# Patient Record
Sex: Female | Born: 1935 | Race: White | Hispanic: No | Smoking: Former smoker
Health system: Southern US, Community
[De-identification: ages and names within clinical notes are randomized; demographics above are authoritative.]

## PROBLEM LIST (undated history)

## (undated) DIAGNOSIS — J449 Chronic obstructive pulmonary disease, unspecified: Secondary | ICD-10-CM

## (undated) DIAGNOSIS — D649 Anemia, unspecified: Secondary | ICD-10-CM

## (undated) DIAGNOSIS — I719 Aortic aneurysm of unspecified site, without rupture: Secondary | ICD-10-CM

## (undated) DIAGNOSIS — N189 Chronic kidney disease, unspecified: Secondary | ICD-10-CM

## (undated) DIAGNOSIS — I10 Essential (primary) hypertension: Secondary | ICD-10-CM

## (undated) DIAGNOSIS — K5790 Diverticulosis of intestine, part unspecified, without perforation or abscess without bleeding: Secondary | ICD-10-CM

## (undated) DIAGNOSIS — H9192 Unspecified hearing loss, left ear: Secondary | ICD-10-CM

## (undated) DIAGNOSIS — K551 Chronic vascular disorders of intestine: Secondary | ICD-10-CM

## (undated) DIAGNOSIS — I7092 Chronic total occlusion of artery of the extremities: Secondary | ICD-10-CM

## (undated) DIAGNOSIS — K56609 Unspecified intestinal obstruction, unspecified as to partial versus complete obstruction: Secondary | ICD-10-CM

## (undated) DIAGNOSIS — I495 Sick sinus syndrome: Secondary | ICD-10-CM

## (undated) DIAGNOSIS — Z45010 Encounter for checking and testing of cardiac pacemaker pulse generator [battery]: Secondary | ICD-10-CM

## (undated) DIAGNOSIS — I471 Supraventricular tachycardia: Secondary | ICD-10-CM

## (undated) HISTORY — DX: Chronic total occlusion of artery of the extremities: I70.92

## (undated) HISTORY — DX: Supraventricular tachycardia: I47.1

## (undated) HISTORY — DX: Diverticulosis of intestine, part unspecified, without perforation or abscess without bleeding: K57.90

## (undated) HISTORY — DX: Sick sinus syndrome: I49.5

## (undated) HISTORY — DX: Anemia, unspecified: D64.9

## (undated) HISTORY — DX: Essential (primary) hypertension: I10

## (undated) HISTORY — DX: Unspecified hearing loss, left ear: H91.92

## (undated) HISTORY — PX: APPENDECTOMY: SHX54

## (undated) HISTORY — DX: Chronic vascular disorders of intestine: K55.1

## (undated) HISTORY — DX: Chronic kidney disease, unspecified: N18.9

## (undated) HISTORY — DX: Unspecified intestinal obstruction, unspecified as to partial versus complete obstruction: K56.609

## (undated) HISTORY — PX: ABDOMINAL HYSTERECTOMY: SHX81

## (undated) HISTORY — DX: Encounter for checking and testing of cardiac pacemaker pulse generator (battery): Z45.010

## (undated) HISTORY — PX: PACEMAKER INSERTION: SHX728

---

## 2004-11-08 ENCOUNTER — Inpatient Hospital Stay (HOSPITAL_COMMUNITY): Admission: EM | Admit: 2004-11-08 | Discharge: 2004-11-12 | Payer: Self-pay | Admitting: *Deleted

## 2004-12-10 ENCOUNTER — Emergency Department (HOSPITAL_COMMUNITY): Admission: EM | Admit: 2004-12-10 | Discharge: 2004-12-11 | Payer: Self-pay | Admitting: Emergency Medicine

## 2004-12-12 ENCOUNTER — Inpatient Hospital Stay (HOSPITAL_COMMUNITY): Admission: EM | Admit: 2004-12-12 | Discharge: 2004-12-21 | Payer: Self-pay | Admitting: Emergency Medicine

## 2004-12-15 ENCOUNTER — Ambulatory Visit: Payer: Self-pay | Admitting: Internal Medicine

## 2005-04-06 ENCOUNTER — Inpatient Hospital Stay (HOSPITAL_COMMUNITY): Admission: EM | Admit: 2005-04-06 | Discharge: 2005-04-10 | Payer: Self-pay | Admitting: Family Medicine

## 2010-04-13 ENCOUNTER — Encounter: Payer: Self-pay | Admitting: Internal Medicine

## 2010-07-20 ENCOUNTER — Emergency Department (HOSPITAL_COMMUNITY): Payer: Medicare Other

## 2010-07-20 ENCOUNTER — Inpatient Hospital Stay (HOSPITAL_COMMUNITY)
Admission: EM | Admit: 2010-07-20 | Discharge: 2010-08-02 | DRG: 356 | Disposition: A | Discharge status: Home Health | Payer: Medicare Other | Attending: Internal Medicine | Admitting: Internal Medicine

## 2010-07-20 DIAGNOSIS — N289 Disorder of kidney and ureter, unspecified: Secondary | ICD-10-CM | POA: Diagnosis present

## 2010-07-20 DIAGNOSIS — R627 Adult failure to thrive: Secondary | ICD-10-CM | POA: Diagnosis present

## 2010-07-20 DIAGNOSIS — I774 Celiac artery compression syndrome: Secondary | ICD-10-CM | POA: Diagnosis present

## 2010-07-20 DIAGNOSIS — IMO0002 Reserved for concepts with insufficient information to code with codable children: Secondary | ICD-10-CM

## 2010-07-20 DIAGNOSIS — I10 Essential (primary) hypertension: Secondary | ICD-10-CM | POA: Diagnosis present

## 2010-07-20 DIAGNOSIS — I701 Atherosclerosis of renal artery: Secondary | ICD-10-CM | POA: Diagnosis present

## 2010-07-20 DIAGNOSIS — I6529 Occlusion and stenosis of unspecified carotid artery: Secondary | ICD-10-CM | POA: Diagnosis present

## 2010-07-20 DIAGNOSIS — Z9079 Acquired absence of other genital organ(s): Secondary | ICD-10-CM

## 2010-07-20 DIAGNOSIS — J189 Pneumonia, unspecified organism: Secondary | ICD-10-CM | POA: Diagnosis not present

## 2010-07-20 DIAGNOSIS — Z950 Presence of cardiac pacemaker: Secondary | ICD-10-CM

## 2010-07-20 DIAGNOSIS — Z79899 Other long term (current) drug therapy: Secondary | ICD-10-CM

## 2010-07-20 DIAGNOSIS — K551 Chronic vascular disorders of intestine: Principal | ICD-10-CM | POA: Diagnosis present

## 2010-07-20 DIAGNOSIS — I739 Peripheral vascular disease, unspecified: Secondary | ICD-10-CM | POA: Diagnosis present

## 2010-07-20 DIAGNOSIS — I708 Atherosclerosis of other arteries: Secondary | ICD-10-CM | POA: Diagnosis present

## 2010-07-20 DIAGNOSIS — N269 Renal sclerosis, unspecified: Secondary | ICD-10-CM | POA: Diagnosis present

## 2010-07-20 DIAGNOSIS — I700 Atherosclerosis of aorta: Secondary | ICD-10-CM | POA: Diagnosis present

## 2010-07-20 DIAGNOSIS — I70209 Unspecified atherosclerosis of native arteries of extremities, unspecified extremity: Secondary | ICD-10-CM | POA: Diagnosis present

## 2010-07-20 DIAGNOSIS — Z01812 Encounter for preprocedural laboratory examination: Secondary | ICD-10-CM

## 2010-07-20 DIAGNOSIS — E440 Moderate protein-calorie malnutrition: Secondary | ICD-10-CM | POA: Diagnosis present

## 2010-07-20 DIAGNOSIS — E162 Hypoglycemia, unspecified: Secondary | ICD-10-CM | POA: Diagnosis not present

## 2010-07-20 DIAGNOSIS — N179 Acute kidney failure, unspecified: Secondary | ICD-10-CM | POA: Diagnosis present

## 2010-07-20 DIAGNOSIS — J9600 Acute respiratory failure, unspecified whether with hypoxia or hypercapnia: Secondary | ICD-10-CM | POA: Diagnosis not present

## 2010-07-20 DIAGNOSIS — E860 Dehydration: Secondary | ICD-10-CM | POA: Diagnosis present

## 2010-07-20 DIAGNOSIS — D62 Acute posthemorrhagic anemia: Secondary | ICD-10-CM | POA: Diagnosis not present

## 2010-07-20 DIAGNOSIS — F17200 Nicotine dependence, unspecified, uncomplicated: Secondary | ICD-10-CM | POA: Diagnosis present

## 2010-07-20 LAB — COMPREHENSIVE METABOLIC PANEL
ALT: 11 U/L (ref 0–35)
Alkaline Phosphatase: 83 U/L (ref 39–117)
CO2: 27 mEq/L (ref 19–32)
Calcium: 10 mg/dL (ref 8.4–10.5)
Chloride: 97 mEq/L (ref 96–112)
Creatinine, Ser: 1.27 mg/dL — ABNORMAL HIGH (ref 0.4–1.2)
GFR calc non Af Amer: 40 mL/min — ABNORMAL LOW (ref >60)
Potassium: 4 mEq/L (ref 3.5–5.1)
Total Bilirubin: 0.6 mg/dL (ref 0.3–1.2)
Total Protein: 6.7 g/dL (ref 6.0–8.3)

## 2010-07-20 LAB — CBC
HCT: 41.8 % (ref 36.0–46.0)
Hemoglobin: 14 g/dL (ref 12.0–15.0)
MCH: 30.8 pg (ref 26.0–34.0)
MCHC: 33.5 g/dL (ref 30.0–36.0)
RDW: 13.7 % (ref 11.5–15.5)

## 2010-07-20 LAB — POCT CARDIAC MARKERS
CKMB, poc: 1.3 ng/mL (ref 1.0–8.0)
Troponin i, poc: <0.05 ng/mL (ref 0.00–0.09)

## 2010-07-20 MED ORDER — IOHEXOL 300 MG/ML  SOLN
80.0000 mL | Freq: Once | INTRAMUSCULAR | Status: AC | PRN
  Administered 2010-07-20: 80 mL via INTRAVENOUS

## 2010-07-21 LAB — COMPREHENSIVE METABOLIC PANEL
AST: 13 U/L (ref 0–37)
Albumin: 2.5 g/dL — ABNORMAL LOW (ref 3.5–5.2)
BUN: 32 mg/dL — ABNORMAL HIGH (ref 6–23)
Calcium: 9 mg/dL (ref 8.4–10.5)
Creatinine, Ser: 1.25 mg/dL — ABNORMAL HIGH (ref 0.4–1.2)
GFR calc non Af Amer: 41 mL/min — ABNORMAL LOW (ref >60)
Potassium: 4.9 mEq/L (ref 3.5–5.1)
Total Protein: 5.1 g/dL — ABNORMAL LOW (ref 6.0–8.3)

## 2010-07-21 LAB — DIFFERENTIAL
Basophils Absolute: 0 K/uL (ref 0.0–0.1)
Basophils Relative: 0 % (ref 0–1)
Eosinophils Absolute: 0.1 K/uL (ref 0.0–0.7)
Eosinophils Relative: 1 % (ref 0–5)
Lymphocytes Relative: 15 % (ref 12–46)
Lymphs Abs: 1.3 K/uL (ref 0.7–4.0)
Monocytes Absolute: 0.9 K/uL (ref 0.1–1.0)
Monocytes Relative: 10 % (ref 3–12)
Neutro Abs: 6.6 K/uL (ref 1.7–7.7)
Neutrophils Relative %: 74 % (ref 43–77)

## 2010-07-21 LAB — CBC
HCT: 35 % — ABNORMAL LOW (ref 36.0–46.0)
Hemoglobin: 11.2 g/dL — ABNORMAL LOW (ref 12.0–15.0)
MCH: 30.1 pg (ref 26.0–34.0)
MCHC: 32 g/dL (ref 30.0–36.0)
MCV: 94.1 fL (ref 78.0–100.0)
Platelets: 177 K/uL (ref 150–400)
RBC: 3.72 MIL/uL — ABNORMAL LOW (ref 3.87–5.11)
RDW: 13.7 % (ref 11.5–15.5)
WBC: 9 K/uL (ref 4.0–10.5)

## 2010-07-21 LAB — LIPID PANEL
Cholesterol: 132 mg/dL (ref 0–200)
HDL: 36 mg/dL — ABNORMAL LOW (ref >39)
Total CHOL/HDL Ratio: 3.7 RATIO
VLDL: 21 mg/dL (ref 0–40)

## 2010-07-21 NOTE — Consult Note (Signed)
[name] [name], [name] [name]            ACCOUNT NO.:  [id number]  MEDICAL RECORD NO.:  [id number]           PATIENT TYPE:  E  LOCATION:  WLED                         FACILITY:  [facility]  PHYSICIAN:  Fransisco Hertz, MD       DATE OF BIRTH:  12/27/1923  DATE OF CONSULTATION: DATE OF DISCHARGE:                                CONSULTATION   REQUESTING PHYSICIAN:  This consultation was requested by Dr. Patria Mane with [facility].  REASON FOR CONSULTATION:  Rule out mesenteric ischemia.  HISTORY OF PRESENT ILLNESS:  This is an 77 year old female with multiple atherosclerotic risk factors who presents with a chief complaint of abdominal pain.  This patient has had a previous history of partial small-bowel obstruction and recently presented with symptomatology, that she thinks it is different from that.  She describes this as pain that is intermittent and vague, present throughout her abdomen that intensifies with food intake, even smell of food induces nausea and pain in her abdomen.  She has consistently had food fear for now about 2 months  and over the last 2 weeks, the daughter notes over 12 pounds weight loss. Some of these symptoms apparently began to occur in last October and in total she has lost around 70 pounds since then.  Most recently over the last few days she has also had some nausea, vomiting and also diarrhea.  While she was at the ER, she vomited up for oral contrast dye.  She notes from the diarrhea she has no frank hematochezia.  She denies any type of melena.  PAST MEDICAL HISTORY: 1. Hypertension. 2. Tachy-brady syndrome causing syncope. 3. History of paroxysmal supraventricular tachycardia. 4. History of partial small-bowel obstruction. 5. History of diverticulosis.  PAST SURGICAL HISTORY:  Included open appendectomy, a total abdominal hysterectomy, and placement of permanent pacemaker.  SOCIAL HISTORY:  She had a 70-pack year history of smoking cigarettes. Denies  any alcohol or illicit drug use.  FAMILY HISTORY:  Her father died at 71 of some type of fungal infection. Mother died at 64 of end-stage renal disease.  REVIEW OF SYSTEMS:  As noted above, otherwise noted to be negative.  PHYSICAL EXAMINATION:  VITAL SIGNS:  She had a temperature of 97.8, blood pressure 171/42, heart rate of 77, respirations were 18. GENERAL:  In general examination, she appears elderly, no apparent distress, alert and oriented x3. HEENT:  On head exam, normocephalic, atraumatic.  She had obvious temporalis wasting.  ENT Exam:  Hearing is grossly intact.  Oropharynx without any erythema or exudate both of her upper and lower teeth or dentures.  Nares without any drainage or any type of erythema.  On eye exam, pupils were equal, round and reactive to light.  Extraocular movements were intact. NECK:  Supple neck with no nuchal rigidity.  No obvious lymphadenopathy. PULMONARY EXAM:  She has symmetric expansion.  Good air movement.  No rales, rhonchi or wheezing. CARDIAC EXAM:  Regular rate and rhythm.  Normal S1, S2.  I was looking at her tracing on the monitor and there is clear regular firing of the pacemaker which is driving this regular rhythm.  VASCULAR EXAMINATION:  She had easily palpable radial pulses but I had some difficulty finding brachials on both sides.  Carotids were palpable bilaterally with no obvious bruit on the right.  There was a faint bruit on the left.  Aorta was not easily palpated.  I did not feel a big pulsatile mass in the midline.  Bilateral femorals were easily palpable. Bilateral popliteals were easily palpable.  She had easily palpable dorsalis pedis.  I did not feel posterior tibial on either side. ABDOMEN:  On abdominal exam, she is thin, soft abdomen, mild tenderness to palpation throughout without any frank guarding.  There are no obvious masses.  No hepatosplenomegaly.  No costovertebral angle tenderness.  She has a well-healed right  lower quadrant transverse incision and a lower midline incision consistent with the previous operation she has had. MUSCULOSKELETAL:  On musculoskeletal exam, a 5/5 strength in all extremities with obvious muscular atrophy.  There is no ischemic changes in any extremity. NEUROLOGIC:  On neurologic examination, cranial nerves II through XII are intact.  Her motor strength was above.  Sensation is grossly intact in all extremities. PSYCHIATRIC:  On psych exam, judgment appeared to be intact.  Her judgment and mood were appropriate for her clinical situation. SKIN:  On skin exam, there was no obvious rashes anywhere. LYMPHATICS:  On lymphatics exam, there were no cervical, axillary or inguinal lymphadenopathy.  RADIOLOGIC STUDIES:  I reviewed the CT of abdomen and pelvis, this was done with 5 mm cuts which fundamentally limits the quality of the interpretation.  It was read as possible inflammation at the proximal jejunum and duodenum, however, I do not really appreciated this on my evaluation of it.  Additionally, there was a comment made on possible segmental occlusion at SMA, however, I think the cuts on this film are too coarse to make that type of diagnosis.  There is frank contrast present in the celiac, SMA and IMA, so this demonstrates basically that this patient's abdominal pain is not secondary to acute mesenteric ischemia as there is frank perfusion in all 3 vessels.  Also the timing of fill in the SMA is at the exact timing as the celiac which would not be expected if in fact there was occlusion at the SMA and subsequently  collateral filling at the SMA.  I do not note any frank masses in the abdomen to account for this patient's symptomatology.  She does have an extensive amount of stool in her colon, however.  LABORATORY DATA:  Also reviewed laboratory studies.  Lipase was 22. Chemistries; sodium 133, potassium 4.0, chloride of 97, bicarb 27, glucose 104, BUN 40,  creatinine was up to 1.27.  Also LFTs were done. Total bilirubin 0.6, alk phos 83, AST was 11, ALT is 11, total protein 6.7, albumin was 3.2, calcium 1.0.  Cardiac markers were completely negative on this patient.  There was a CBC, complete white count of 15.0, H and H of 14.0 and 41.8, platelet count 132.  DIAGNOSES: 1. Failure to thrive in this patient.  She had dropped over 70 pounds     over 6 months that was not intentional, poor p.o. tolerance to     intake. 2. She has some abdominal pain along with her additional     symptomatology associated with this pain.  It is not absolutely     clear to me that this pain is secondary to mesenteric ischemia. 3. Possible chronic mesenteric ischemia, per history is somewhat  consistent with chronic mesenteric ischemia.  The weight loss is     consistent, the food fear; however, for 6 months of 70 pounds     weight loss, I would have expected more protein wasting than I am     seeing on her LFTs.  Also I am kind of surprised with the findings     on the CTA with such good perfusion noted in all 3 mesenteric     vessels and based on the findings on the CT, I think it is highly     unlikely there is acute mesenteric ischemia involved.  The 5-mm cut non-     CTA images make it difficult to fully evaluate the vasculature.  My     gold standard in this situation would be mesenteric angiogram but     she currently has creatinine of 1.27, this is likely related to     dehydration placed on her history of diarrhea and chronic nausea     and vomiting.  So, now after receiving 80 cc of contrast, I expect     her to possibly develop some contrast nephropathy, so I would     expect creatinine actually to increase even further.  So, she needs     to be resuscitated, admitted to medical service, resuscitated, and     we will see how her kidney function recovers.  Other things, she     needs to have full set of nutrition labs drawn including prealbumin      and CRP to get idea of how much protein wasting her body has truly     sustained.  In general, she also needs, based on the extensive     amount of atherosclerotic disease I am seeing on CAT scan, she will     need bilateral lower extremity ABIs, bilateral carotid duplexes,     and a carotid evaluation.  These three I am recommending in case she     is going to need any type of formal surgical revascularization.  I     have already discussed with the patient the workup in this case.     After she is adequately resuscitated, I would proceed forward with     mesenteric angiogram.  My suspicion is in fact she may not require     any type of intervention and in this setting with greater than 70     pounds of weight loss in a 1-month time period, I am a bit hesitant     to proceed forward; also with any type of major mesentery     revascularization as she may not be able to heal if she does not     have adequate protein stores to regenerative her tissue.  So in     this setting,  sometimes temporizing with possible mesenteric     stenting may be done including celiac artery stenting which is     somewhat controversial at this time.  Tentatively I will arrange     for the mesenteric angiogram for this coming Thursday.  She should     give [place] enough time to get some of the workup completed and for her to     be adequately rehydrated.  Additionally based on amount of stool     she has in her colon, I also recommend bowel regimen and some     enemas that may help with some of her abdominal complaints.  We will be  continuing to follow along with this patient's care.     Fransisco Hertz, MD     BLC/MEDQ  D:  07/20/2010  T:  07/21/2010  Job:  [id number]  Electronically Signed by Leonides Sake MD on 07/21/2010 07:45:39 PM

## 2010-07-22 DIAGNOSIS — K551 Chronic vascular disorders of intestine: Secondary | ICD-10-CM

## 2010-07-22 DIAGNOSIS — R55 Syncope and collapse: Secondary | ICD-10-CM

## 2010-07-22 LAB — BASIC METABOLIC PANEL
BUN: 21 mg/dL (ref 6–23)
CO2: 24 mEq/L (ref 19–32)
Chloride: 111 mEq/L (ref 96–112)
Glucose, Bld: 86 mg/dL (ref 70–99)
Potassium: 4.9 mEq/L (ref 3.5–5.1)

## 2010-07-23 LAB — BASIC METABOLIC PANEL
BUN: 13 mg/dL (ref 6–23)
CO2: 26 mEq/L (ref 19–32)
Chloride: 112 mEq/L (ref 96–112)
Creatinine, Ser: 0.9 mg/dL (ref 0.4–1.2)
Potassium: 4.7 mEq/L (ref 3.5–5.1)

## 2010-07-24 ENCOUNTER — Inpatient Hospital Stay (HOSPITAL_COMMUNITY): Payer: Medicare Other

## 2010-07-24 ENCOUNTER — Ambulatory Visit (HOSPITAL_COMMUNITY)
Admission: RE | Admit: 2010-07-24 | Discharge: 2010-07-24 | Disposition: A | Discharge status: Home / Self Care | Payer: Medicare Other | Source: Ambulatory Visit | Attending: Vascular Surgery | Admitting: Vascular Surgery

## 2010-07-24 DIAGNOSIS — K551 Chronic vascular disorders of intestine: Secondary | ICD-10-CM

## 2010-07-24 DIAGNOSIS — I359 Nonrheumatic aortic valve disorder, unspecified: Secondary | ICD-10-CM | POA: Insufficient documentation

## 2010-07-24 DIAGNOSIS — I701 Atherosclerosis of renal artery: Secondary | ICD-10-CM | POA: Insufficient documentation

## 2010-07-25 LAB — BASIC METABOLIC PANEL
BUN: 14 mg/dL (ref 6–23)
CO2: 25 mEq/L (ref 19–32)
Calcium: 9.2 mg/dL (ref 8.4–10.5)
Chloride: 104 mEq/L (ref 96–112)
Creatinine, Ser: 0.99 mg/dL (ref 0.4–1.2)
GFR calc Af Amer: >60 mL/min (ref >60)

## 2010-07-25 LAB — DIFFERENTIAL
Basophils Relative: 0 % (ref 0–1)
Eosinophils Absolute: 0 10*3/uL (ref 0.0–0.7)
Lymphs Abs: 1 10*3/uL (ref 0.7–4.0)
Monocytes Absolute: 1.2 10*3/uL — ABNORMAL HIGH (ref 0.1–1.0)
Monocytes Relative: 12 % (ref 3–12)

## 2010-07-25 LAB — CBC
Hemoglobin: 10.8 g/dL — ABNORMAL LOW (ref 12.0–15.0)
MCH: 30.5 pg (ref 26.0–34.0)
MCHC: 33.3 g/dL (ref 30.0–36.0)
MCV: 91.5 fL (ref 78.0–100.0)
Platelets: 201 10*3/uL (ref 150–400)

## 2010-07-26 DIAGNOSIS — K551 Chronic vascular disorders of intestine: Secondary | ICD-10-CM

## 2010-07-27 LAB — DIFFERENTIAL
Basophils Absolute: 0.1 10*3/uL (ref 0.0–0.1)
Basophils Relative: 1 % (ref 0–1)
Eosinophils Absolute: 0.1 10*3/uL (ref 0.0–0.7)
Eosinophils Relative: 2 % (ref 0–5)
Monocytes Absolute: 0.8 10*3/uL (ref 0.1–1.0)
Monocytes Relative: 11 % (ref 3–12)
Neutro Abs: 4.9 10*3/uL (ref 1.7–7.7)

## 2010-07-27 LAB — BASIC METABOLIC PANEL
CO2: 26 mEq/L (ref 19–32)
Calcium: 9.5 mg/dL (ref 8.4–10.5)
Creatinine, Ser: 1.23 mg/dL — ABNORMAL HIGH (ref 0.4–1.2)
GFR calc Af Amer: 50 mL/min — ABNORMAL LOW (ref >60)
GFR calc non Af Amer: 42 mL/min — ABNORMAL LOW (ref >60)
Sodium: 137 mEq/L (ref 135–145)

## 2010-07-27 LAB — CBC
Hemoglobin: 11.1 g/dL — ABNORMAL LOW (ref 12.0–15.0)
MCH: 31.4 pg (ref 26.0–34.0)
MCHC: 34 g/dL (ref 30.0–36.0)
Platelets: 214 10*3/uL (ref 150–400)
RDW: 14.2 % (ref 11.5–15.5)

## 2010-07-28 LAB — BASIC METABOLIC PANEL
CO2: 26 mEq/L (ref 19–32)
Chloride: 102 mEq/L (ref 96–112)
Creatinine, Ser: 1.19 mg/dL (ref 0.4–1.2)
GFR calc Af Amer: 52 mL/min — ABNORMAL LOW (ref >60)
Sodium: 136 mEq/L (ref 135–145)

## 2010-07-28 LAB — CBC
Hemoglobin: 10.3 g/dL — ABNORMAL LOW (ref 12.0–15.0)
MCH: 29.8 pg (ref 26.0–34.0)
Platelets: 219 10*3/uL (ref 150–400)
RBC: 3.46 MIL/uL — ABNORMAL LOW (ref 3.87–5.11)
WBC: 7.1 10*3/uL (ref 4.0–10.5)

## 2010-07-28 NOTE — Op Note (Signed)
NAMEDESHONNA, TRNKA            ACCOUNT NO.:  [id number]  MEDICAL RECORD NO.:  [id number]           PATIENT TYPE:  O  LOCATION:  CATH                         FACILITY:  [facility]  PHYSICIAN:  Fransisco Hertz, MD       DATE OF BIRTH:  [birth year]  DATE OF PROCEDURE: DATE OF DISCHARGE:  07/24/2010                              OPERATIVE REPORT   PROCEDURE: 1. Right common femoral artery cannulation under ultrasound guidance. 2. Aortogram.  PREOPERATIVE DIAGNOSIS:  Possible chronic mesenteric ischemia.  POSTOPERATIVE DIAGNOSIS:  Possible chronic mesenteric ischemia.  SURGEON:  Arlys John L. Imogene Burn, MD  ANESTHESIA:  Conscious sedation.  ESTIMATED BLOOD LOSS:  Minimal.  CONTRAST:  40 mL.  There were no specimens in this case.  FINDINGS: 1. There is a heavily atherosclerotic aorta with calcification     throughout. 2. Bilateral renal artery stenosis with the right renal artery with     greater than 90% stenosis and the left renal artery with greater     than 75% stenosis. 3. There is a patent inferior mesenteric artery reconstitutes into the     superior mesenteric artery. 4. There is a celiac artery reconstituted from the branches of the     superior mesenteric artery and also with possibly some left renal     artery branches. 5. There is a patent bilateral common iliac arteries which there is a     75% calcified stenosis in the left common iliac artery. 6. Patent bilateral external iliac arteries and internal iliac     arteries. 7. There is a left internal iliac artery collateral which augments the     collateral flow to the superior mesenteric artery. 8. The celiac and SMA are occluded proximally on the lateral imaging.  INDICATIONS:  This is an 77 year old patient who has been presenting with symptomatology consistent with chronic mesenteric ischemia.  Based on her CT scan, I felt that she had significant amount of disease in the aorta but I could not absolutely tell in which  fashion the celiac and SMA are reconstituting.  There was no evidence of acute mesenteric ischemia as there was still perfusion noted in the celiac branches and the SMA branches, so subsequently I felt that a study with an aortogram, possible mesenteric angiogram was going to be necessary.  The patient is aware of the risks of this procedure including access complications, bleeding, infection, possible embolization, possible rupture of any angioplastied vessels and possible need for surgical intervention.  DESCRIPTION OF OPERATION:  After full informed written consent was obtained from the patient, she was brought back to the angio suite and placed supine upon the angio table.  She was connected to monitoring equipment and given conscious sedation, the amounts of which are documented in her chart.  She was then prepped and draped in the standard fashion for a an aortogram.  I turned my attention first to her right groin.  Under ultrasound guidance, I identified the right common femoral artery and cannulated with an 18-gauge needle and passed a Bentson wire up into the aorta.  A 6-French sheath was then loaded over the wire into the  right common femoral artery.  An Omni flush catheter was then loaded over the wire and advanced to the level of L1.  The wire was removed.  The catheter was connected to power injector circuit after de-airing and declotting the circuit.  Findings of the power injector aortogram can be found above.  I then placed her into lateral position and performed a lateral aortogram.  This demonstrated the findings that are listed above, specifically an occluded proximal iliac artery and SMA. Based on the imaging, she has rapid filling through collaterals and her mesenteric system, so she is not at risk immediately of acute mesenteric ischemia, but with 2/3 vessels occluded she is at risk for ventrally mesenteric ischemia if her IMA should become compromised. She also has  extensive amount of atherosclerotic disease as evident in the findings, so the order of intervention in this patient is going to be dependant partially also on her cardiac risk stratification.  I did not have consent for intervention in the other tissue beds so I did not proceed with immediate intervention in any of these other vessel beds.  Subsequently at this  point, I decided to terminate the procedure.  I reconstituted the crook of this  Omni flush catheter, pulled out the catheter and wire, and then I aspirated the  sheath.  There was no clot and I instilled heparinized saline within the sheath.   The plan is to remove this sheath in the holding area and the patient will be  transferred back to [facility] where she will undergo a cardiology evaluation  to help determine her risk stratification and optimize her for any type of  operation.  There are multiple issues in this patient given her age and multiple comorbidities which would influence the risk-benefit anaylsis in this  patient in regards to an aorto-mesenteric bypass.  COMPLICATIONS:  None.  CONDITION:  Stable.     Fransisco Hertz, MD     BLC/MEDQ  D:  07/24/2010  T:  07/25/2010  Job:  [id number]  Electronically Signed by Leonides Sake MD on 07/28/2010 11:29:14 AM

## 2010-07-29 LAB — IRON AND TIBC
Iron: 60 ug/dL (ref 42–135)
Saturation Ratios: 27 % (ref 20–55)
TIBC: 222 ug/dL — ABNORMAL LOW (ref 250–470)

## 2010-07-29 LAB — BASIC METABOLIC PANEL
BUN: 18 mg/dL (ref 6–23)
CO2: 30 mEq/L (ref 19–32)
Chloride: 102 mEq/L (ref 96–112)
GFR calc Af Amer: 49 mL/min — ABNORMAL LOW (ref >60)
Potassium: 4.8 mEq/L (ref 3.5–5.1)

## 2010-07-29 LAB — VITAMIN B12: Vitamin B-12: 997 pg/mL — ABNORMAL HIGH (ref 211–911)

## 2010-07-29 LAB — CBC
Hemoglobin: 10.6 g/dL — ABNORMAL LOW (ref 12.0–15.0)
MCV: 93.1 fL (ref 78.0–100.0)
Platelets: 219 10*3/uL (ref 150–400)
RBC: 3.48 MIL/uL — ABNORMAL LOW (ref 3.87–5.11)
WBC: 7.5 10*3/uL (ref 4.0–10.5)

## 2010-07-29 LAB — FOLATE: Folate: 5.6 ng/mL

## 2010-07-30 ENCOUNTER — Ambulatory Visit (HOSPITAL_COMMUNITY)
Admission: RE | Admit: 2010-07-30 | Discharge: 2010-07-30 | Disposition: A | Discharge status: Home / Self Care | Payer: Medicare Other | Source: Ambulatory Visit | Attending: Vascular Surgery | Admitting: Vascular Surgery

## 2010-07-30 DIAGNOSIS — I70219 Atherosclerosis of native arteries of extremities with intermittent claudication, unspecified extremity: Secondary | ICD-10-CM

## 2010-07-30 HISTORY — PX: ILIAC ARTERY STENT: SHX1786

## 2010-07-30 LAB — BASIC METABOLIC PANEL
CO2: 32 mEq/L (ref 19–32)
Calcium: 9.6 mg/dL (ref 8.4–10.5)
Creatinine, Ser: 1.27 mg/dL — ABNORMAL HIGH (ref 0.4–1.2)
Glucose, Bld: 102 mg/dL — ABNORMAL HIGH (ref 70–99)

## 2010-07-30 LAB — POCT ACTIVATED CLOTTING TIME: Activated Clotting Time: 152 seconds

## 2010-07-31 LAB — CBC
MCH: 30.4 pg (ref 26.0–34.0)
MCV: 93.2 fL (ref 78.0–100.0)
Platelets: 197 10*3/uL (ref 150–400)
RBC: 3.09 MIL/uL — ABNORMAL LOW (ref 3.87–5.11)

## 2010-07-31 LAB — BASIC METABOLIC PANEL
BUN: 19 mg/dL (ref 6–23)
CO2: 29 mEq/L (ref 19–32)
Chloride: 103 mEq/L (ref 96–112)
Creatinine, Ser: 1.2 mg/dL (ref 0.4–1.2)
Glucose, Bld: 89 mg/dL (ref 70–99)

## 2010-08-01 LAB — CBC
MCH: 31 pg (ref 26.0–34.0)
MCHC: 33.1 g/dL (ref 30.0–36.0)
MCV: 93.6 fL (ref 78.0–100.0)
Platelets: 201 10*3/uL (ref 150–400)
RDW: 14.3 % (ref 11.5–15.5)
WBC: 9 10*3/uL (ref 4.0–10.5)

## 2010-08-01 LAB — BASIC METABOLIC PANEL
BUN: 20 mg/dL (ref 6–23)
Calcium: 9.6 mg/dL (ref 8.4–10.5)
Creatinine, Ser: 1.24 mg/dL — ABNORMAL HIGH (ref 0.4–1.2)
GFR calc non Af Amer: 41 mL/min — ABNORMAL LOW (ref >60)

## 2010-08-02 LAB — CBC
MCHC: 32.3 g/dL (ref 30.0–36.0)
Platelets: 226 10*3/uL (ref 150–400)
RDW: 14.3 % (ref 11.5–15.5)
WBC: 9.2 10*3/uL (ref 4.0–10.5)

## 2010-08-02 LAB — BASIC METABOLIC PANEL
Calcium: 9.3 mg/dL (ref 8.4–10.5)
GFR calc non Af Amer: 53 mL/min — ABNORMAL LOW (ref >60)
Potassium: 4.4 mEq/L (ref 3.5–5.1)
Sodium: 135 mEq/L (ref 135–145)

## 2010-08-04 NOTE — Consult Note (Signed)
[name] [name], [name] [name]            ACCOUNT NO.:  [id number]  MEDICAL RECORD NO.:  [id number]           PATIENT TYPE:  LOCATION:                                 FACILITY:  PHYSICIAN:  Armanda Magic, M.D.     DATE OF BIRTH:  September 10, 1923  DATE OF CONSULTATION:  07/25/2010 DATE OF DISCHARGE:                                CONSULTATION   REFERRING PHYSICIAN:  [facility], [facility].  CHIEF COMPLAINT:  Cardiac clearance.  HISTORY OF PRESENT ILLNESS:  This is an 77 year old female with a history of hypertension, tachy-brady syndrome, status post permanent pacemaker placement and SVT, who presented unable to eat for 2 weeks. She complains of epigastric pain after eating and diarrhea.  She presented to emergency with complaints of weakness.  CT of the abdomen and pelvis showed abdominal wall thickening and duodenum and jejunum consistent with ischemic enteritis.  She underwent an abdominal angiography showing atherosclerosis of the aorta with calcification, bilateral renal artery stenosis, occluded proximal celiac and superior mesenteric artery with intact bowel and distal perfusion via the IMA/IAA collaterals.  We are now asked to consult for cardiac clearance for possible revascularizations with aorta to innominate was estimate bypass.  PAST MEDICAL HISTORY:  Includes hypertension, chronic tobacco abuse , SVT, tachy-brady syndrome, status post permanent pacemaker placement and syncope.  PAST SURGICAL HISTORY:  Appendectomy, total abdominal hysterectomy, permanent pacemaker.  SOCIAL HISTORY:  She is widowed.  She lives with daughter.  She denies tobacco or alcohol use.  FAMILY HISTORY:  Father died of pneumoconiosis at 19.  Her mother died at 47 from renal failure.  She has a sister died at 33 of brain tumor.  ALLERGIES:  She has no known drug allergies.  MEDICATIONS:  Please see HPI.  REVIEW OF SYSTEMS:  Other than what was stated in the HPI is  negative.  PHYSICAL EXAMINATION:  VITAL SIGNS:  Blood pressure is 125/58, heart rate 91.  She is afebrile, O2 saturations 98% on room air. GENERAL:  This well-developed, well-nourished female in no acute distress. HEENT:  Benign. NECK:  Supple without lymphadenopathy.  Carotid upstrokes are +2 bilaterally, no bruits. LUNGS:  Clear to auscultation throughout. HEART:  Regular rate and rhythm.  No murmurs, rubs or gallops.  Normal S1 and S2. ABDOMEN:  Benign. EXTREMITIES:  No cyanosis, erythema or edema.  LABORATORY DATA:  Sodium 139, potassium 4, chloride 104, bicarb 25, BUN 14, creatinine 0.99.  White cell count 10.2, hemoglobin 10.3, hematocrit 32.4, platelet count 201.  Chest x-ray shows cardiomegaly with mild pulmonary venous congestion, small bilateral pleural effusions, patchy airspace disease.  A 2-D echocardiogram shows EF of 65-70% with normal LV function and grade 1 diastolic dysfunction, trace trivial AI, mild MR with MAC.  EKG shows A-paced rhythm with nonspecific ST abnormality.  ASSESSMENT: 1. 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 2. Bilateral renal artery stenosis greater than 75%. 3. Carotid artery stenosis. 4. Left common iliac artery stenosis. 5. Hypertension. 6. Permanent pacemaker.  PLAN:  The patient has significant  underlying atherosclerosis of the aorta and most likely has significant underlying CAD as well.  She is asymptomatic from a cardiac standpoint.  Given her age, she is at increased risk of operative complications from a large abdominal surgery for vascular bypass.  If she is willing to proceed with the risk of surgery then would recommend further risk stratification for underlying CAD with nuclear stress test.  If she is not willing to take the risk of surgery, then would not proceed with further cardiac workup at this time.  We will await the  patient's decision as to whether she wants to proceed with surgery and then schedule nuclear stress test if she is agreeable.     Armanda Magic, M.D.     TT/MEDQ  D:  07/25/2010  T:  07/26/2010  Job:  [id number]  cc:   Fransisco Hertz, MD Lyn Records, M.D.  Electronically Signed by Armanda Magic M.D. on 08/04/2010 02:12:43 PM

## 2010-08-12 NOTE — Discharge Summary (Signed)
[name] [name], [name] [name]            ACCOUNT NO.:  [id number]  MEDICAL RECORD NO.:  [id number]           PATIENT TYPE:  I  LOCATION:  1311                         FACILITY:  [facility]  PHYSICIAN:  Erick Blinks, MD     DATE OF BIRTH:  1923-07-29  DATE OF ADMISSION:  07/20/2010 DATE OF DISCHARGE:  07/30/2010                              DISCHARGE SUMMARY   PRIMARY CARE PHYSICIAN:  The patient currently does not have a primary care physician.  She is from [place].  DISCHARGE DIAGNOSES: 1. Abdominal pain secondary to chronic mesenteric ischemia, improved. 2. Severe peripheral artery disease status post left common iliac     artery stent. 3. Healthcare-acquired pneumonia, stable. 4. Acute respiratory failure, resolved. 5. Acute renal failure, resolved. 6. History of tachy-brady syndrome status post permanent pacemaker. 7. Acute blood loss anemia, stable. 8. Tobacco abuse.  DISCHARGE MEDICATIONS: 1. Nicotine patch 21 mg per 24 hours one patch transdermal daily. 2. Aspirin 81 mg p.o. daily. 3. Protonix 40 mg p.o. daily. 4. Plavix 75 mg p.o. daily. 5. Atenolol 50 mg p.o. daily. 6. Losartan 100 mg p.o. daily. 7. Hydrochlorothiazide 12.5 mg p.o. daily. 8. Vitamin D2 50,000 units one capsule every Wednesday. 9. Tylenol PM one capsule p.o. q.h.s. 10.Nicotine 14 mg per 24 hours patch transdermal daily to be started     once the 21 mg patches are complete. 11.Nicotine 7 mg per 24 hours patch one patch transdermal daily to be     completed once the 14 mg patches are complete.  ADMISSION HISTORY:  This is an 77 year old female who presented to the emergency room with complaints of epigastric discomfort while eating. She had generalized weakness as well.  She denied any fever.  The patient was evaluated in the emergency room and there was some concern for possible abdominal angina.  Dr. Imogene Burn from Vascular Surgery was consulted and evaluated the patient.  The plan was to undergo  an angiogram and therefore the patient was admitted for further evaluation. For further details, please refer to the history and physical dictated by Dr. Selena Batten.  HOSPITAL COURSE: 1. Peripheral arterial disease.  The patient was evaluated by Dr. Imogene Burn     from Vascular Surgery.  She underwent a right common femoral artery     cannulation under ultrasound guidance with aortogram on May 3,     which showed bilateral renal artery stenosis with right renal     artery greater than 90% stenosis and the left renal artery with     greater than 75% stenosis.  Patent inferior mesenteric artery.     Celiac artery reconstituted from the branches of the superior     mesenteric artery.  Patent bilateral common iliac arteries, which     there is 75% calcified stenosis in the left common iliac artery.     Celiac and SMA are occluded proximally on the lateral imaging.  Due     to these findings, subsequently on May 9 the patient underwent a     left common iliac artery stenting with stent placement as well as a     left leg angiogram.  She  tolerated this procedure well.  It was     also considered to place a stent in the renal artery, but due to     the significant dye load this will be deferred to a future time.     The patient has been started on Plavix.  She will follow up with     Dr. Imogene Burn for further evaluation and possible stenting of the renal     artery in the next few weeks. 2. Abdominal pain.  This has resolved after admission.  The patient is     tolerating a p.o. diet at this time without any concerns. 3. Healthcare-acquired pneumonia.  The patient was noted to be hypoxic     as well as febrile during the hospital here.  She was treated     adequately with antibiotics.  She is currently back to her     baseline.  She does not have an elevated WBC count nor is she     febrile and she has no respiratory compromise. 4. Acute blood loss anemia.  The patient did have a minor decrease in     her  hemoglobin to 9.6 after the procedure.  This has been stable     since the procedure and she has not had any further bleeding. 5. Generalized weakness.  The patient was evaluated by Physical     Therapy and home health physical therapy was recommended.  This     will be set up on discharge.  DISPOSITION:  The patient will be discharged home in the care of her daughter as well as home health PT for further care.  CONSULTATIONS: 1. Fransisco Hertz, M.D. from Vascular Surgery for peripheral arterial     disease. 2. Armanda Magic, M.D. from Cardiology for cardiac clearance.  PROCEDURES: 1. Right common femoral artery cannulation under ultrasound guidance     on February 23. 2. Common iliac artery stenting and left leg angiogram on May 9.  DISCHARGE INSTRUCTIONS:  The patient should continue on a heart-healthy diet and conduct her activity as tolerated.  She will be set up with home health services.  Currently she does not have a primary care doctor, but she has seen Dr. Johnella Moloney in the past.  Her daughter says that she will likely try and set back up with Dr. Kevan Ny or possibly someone else from that group.  CONDITION ON DISCHARGE:  Condition at the time of discharge is improved. The plan was discussed with the patient as well as her daughter, who are in agreement.     Erick Blinks, MD     JM/MEDQ  D:  08/02/2010  T:  08/02/2010  Job:  [id number]  Electronically Signed by Durward Mallard MEMON  on 08/12/2010 02:03:16 PM

## 2010-08-12 NOTE — Op Note (Signed)
NAMEJAYLEIGH, [name] [name]            ACCOUNT NO.:  [id number]  MEDICAL RECORD NO.:  [id number]           PATIENT TYPE:  O  LOCATION:  CATH                         FACILITY:  [facility]  PHYSICIAN:  Fransisco Hertz, MD       DATE OF BIRTH:  1923-07-28  DATE OF PROCEDURE:  07/30/2010 DATE OF DISCHARGE:  07/30/2010                              OPERATIVE REPORT   PROCEDURES: 1. Left common iliac artery stenting with an iCAST 6 mm x 22 mm. 2. Left leg angiogram. 3. Left common femoral artery cannulation under ultrasound guidance.  PREOPERATIVE DIAGNOSIS:  Severe left leg peripheral arterial disease.  POSTOPERATIVE DIAGNOSIS:  Severe left leg peripheral arterial disease.  SURGEON:  Fransisco Hertz, MD  ANESTHESIA:  Conscious sedation.  ESTIMATED BLOOD LOSS:  Minimal.  CONTRAST:  70 mL.  SPECIMENS:  None.  FINDINGS: 1. Heavily calcified aorta and bifurcation. 2. There is a greater than 75% stenosis in the left common iliac     artery. 3. The left internal iliac artery remained patent after this     procedure. 4. There is complete resolution of the left common iliac artery     stenosis with no gradient across the stented segment of the common     iliac artery. 5. The left superficial femoral artery is small about 3 mm and heavily     diseased with occlusion about Hunter canal. 6. The collaterals reconstitute above-the-knee popliteal artery. 7. There is patent but diseased popliteal artery on the left side. 8. There is either aberrant left anterior tibial takeoff or more     likely a geniculate collateral that takes off above the knee and     moves from medial to lateral, this appears to be the largest vessel     and the calf. 9. There is a left peroneal artery as the only formal runoff to the     left foot.  The left pedal arch is not well visualized due to the     patient's movement.  INDICATIONS:  This is an 77 year old patient.  I had previously done an aortogram to look for  mesenteric disease.  I had no consent at that point to try to intervene at her left common iliac artery stenosis that  was evident on this angiogram.  She was found on further workup to have an L ABI  below 0.3 and symptomatic, so I felt that to help her symptomatology,  I felt intervention on the left common iliac artery was going to be necessary.   Additionally, a good angiogram testing of the left leg could not be completed until there was adequate inflow to the that leg.  The patient is aware of the risks of this procedure including bleeding, infection, access, complication, possible embolization, possible rupture of any treated arteries, and possible need for additional procedures.  DESCRIPTION OF OPERATION:  After full informed written consent was obtained from the patient, the patient was brought back to the angio suite and placed supine upon angio table.  Prior to being given conscious sedation, she was connected to the monitoring equipment.  She was given the amounts  of conscious sedation as documented in the chart.  I turned my attention first over her left groin.  I identified a left common femoral artery which was not pulsatile.  This was cannulated with a micropuncture needle.  I passed a microwire up into the common iliac artery and then exchanged the needle out for a micro sheath.  The wire was removed and then a Bentson wire was passed successfully up into the aorta with some difficulty.  I then exchanged out this micro sheath for a long 6-French sheath which was advanced into the distal common iliac artery.  I then performed a hand injection at this location and it demonstrated greater than 75% stenosis of this left common iliac artery that was heavily calcified.  The wire had been pulled back.  I tried to advance the wire into the aorta, it would not pass, so I had to exchange the wire out for a Glidewire and was able to, with some maneuvering, get the Glidewire up back  into the aorta.  Based on just wire behavior, the suspicion is in fact the 75% stenosis on injections is actually greater than that and clearly hemodynamically significant, so based on the hand injections, I measured out the artery and felt that a 6 mm x 22 mm covered stent was going to be necessary.  A covered stent was utilized as this patient has extensive calcification and is at risk for rupture of this artery.  I used the end-hole catheter over the Glidewire and then exchanged out the [employer] for [employer].  The catheter was then removed.  I then placed an iCAST 6 mm x 22 mm balloon expandable stent over the wire and advanced it to the appropriate location.  I completed 1 more hand injection to verify the locations of the stenosis and then I  deployed this stent.  I required expansion up to 12 atmospheres with one minute hold to successfully deploy this stent to its full extent.  At lesser atmospheres in fact, there was continued wasting and only upon reaching burst pressure in this balloon were we able to fully get the artery to fully extend.  I then removed the balloon.  I did another hand injection via the sheath.  This demonstrated complete resolution of the previous stenosis.  I then placed an end-hole catheter over the wire, reconstituted tip of the Rosen wire and then I did a pullback gradient off from the aorta back past the stented area.  This demonstrated no gradient across this newly stented segment of the artery.  At this point, I pulled the sheath back down to the distal external iliac artery and then the sheath was connected to the power injector circuit.  A automated left leg runoff was then set up under the findings of the runoff are as listed above.  Based on these findings, I felt that no further intervention was possible today.  At this point, we had given 70 mL of dye and subsequently I felt that additional dye in a patient with essentially only one functional  kidney was not acceptable, so at this point I decided to defer trying to intervene on the left renal artery, so at this point we secured the left femoral sheath in place with the plan to pull it once her anticoagulation reversed.  Please note that prior to placing the stent, I gave the patient in total of about 6000 units of heparin intravenously to obtain a therapeutic bolus. Initially, I gave 4000 U  but her activated clotting time was too low, so she received additional 2000 U of heparin.  COMPLICATIONS:  None.  CONDITION:  Stable.     Fransisco Hertz, MD     BLC/MEDQ  D:  07/30/2010  T:  07/31/2010  Job:  [id number]  Electronically Signed by Leonides Sake MD on 08/12/2010 10:48:36 AM

## 2010-08-15 ENCOUNTER — Ambulatory Visit (INDEPENDENT_AMBULATORY_CARE_PROVIDER_SITE_OTHER): Payer: Medicare Other | Admitting: Vascular Surgery

## 2010-08-15 DIAGNOSIS — I7092 Chronic total occlusion of artery of the extremities: Secondary | ICD-10-CM

## 2010-08-19 NOTE — Assessment & Plan Note (Signed)
OFFICE VISIT  [name], [name] DOB:  02/28/1924                                       08/15/2010 YNWGN#:[id number]  HISTORY OF PRESENT ILLNESS:  This is an 77-year patient that I know well.  She has chronic mesenteric ischemia that I cannot be revascularized due to her severe cardiac disease. In fact the cardiologist was not willing to proceed forward with an extensive workup as they did not feel that she would survive any possible cardiac interventions.  She has an extremely atherosclerotic aorta and a left common carotid artery stenosis that I successfully stented.  Also she has severe bilateral renal artery stenosis, the right of which the kidney had atrophied to the point I did not think intervention was going to be useful.  She has a left renal artery which is stenotic and might benefit from intervention.  At this point, the patient notes she still has poor appetite.  She does not have pain per se with eating.  Weight is roughly stable.  Her left leg she still has continued numbness in the calf but she denies any frank rest pain at this point.  Her past medical history, past social history, past surgical history, family history, review of systems are completely unchanged from when I saw her in the hospital as are the medications and allergies.  PHYSICAL EXAMINATION:  Blood pressure 142/51, heart rate 72, respirations were 12 and temperature 97.6. GENERAL:  She appears cachectic, alert and oriented x3, in no apparent distress. PULMONARY:  Lungs were clear to auscultation bilaterally.  No rales, rhonchi or wheezing. CARDIAC:  Regular rate and rhythm.  Normal S1, S2.  No murmurs, rubs, thrills or gallops. ABDOMEN:  Soft abdomen, nontender, nondistended.  No guarding, no rebound, no hepatosplenomegaly. MUSCULOSKELETAL:  There was no focal weakness. EXTREMITIES:  Appeared to be perfused.  There are no ischemic changes in either feet.  She does have a  cyanotic hue in both feet which  changes with temperature. NEURO:  Cranial pale nerves II-XII were intact.  Motor strength was symmetric bilaterally.  There was some decreased light touch in bilateral lower feet. SKIN:  There are no ulcers and no obvious rashes elsewhere. LYMPHATIC:  No cervical, axillary, or inguinal lymphadenopathy.  MEDICAL DECISION MAKING:  An 77 year old patient with extensive atherosclerotic disease in multiple vessel beds.  Her workup at this point should include an ABI to evaluate the response to the left common iliac artery stenting.  I suspect there will be continued decreased ABIs as she has vessels in her tibial basin that are not revascularizable.  I would also obtain a renal artery duplex to evaluate the size of each kidney and resistive indices.  I already think the right has atrophied too much, so no response to stenting would be expected.  However, there is some question whether or not the left kidney can be salvaged in this patient.  In regards to her poor appetite, she does have known chronic mesenteric ischemia that he is not revascularizable.  She does have a large quite IMA that via meandering artery is perfusing both the SMA and via the SMA branches the celiac, so she does not have acute mesenteric ischemia but her findings are suggestive of chronic mesenteric ischemia.  This is, however, significantly better than when she came to the hospital.  I am going to give her  some Megace as a potential appetite stimulant to try to help her with gain some weight to improve her chances in case in the future of any type of surgical procedure is necessary.  Currently she has significant malnutrition and so I do not think she would easily heal any type of intervention.  I will get these studies over the next 2 weeks and then will have her follow up after that.    Fransisco Hertz, MD Electronically Signed  BLC/MEDQ  D:  08/15/2010  T:  08/19/2010  Job:   [id number]

## 2010-08-21 NOTE — Group Therapy Note (Signed)
[name] [name], VEILLON            ACCOUNT NO.:  [id number]  MEDICAL RECORD NO.:  [id number]           PATIENT TYPE:  I  LOCATION:  1311                         FACILITY[facility]  PHYSICIAN:  Altha Harm, MDDATE OF BIRTH:  10-27-1923                                PROGRESS NOTE   Diagnoses at the time of this summary include the following: 1. Abdominal pain, resolved. 2. Mesenteric ischemia. 3. Severe peripheral arterial disease with bilateral renal artery     stenosis, left common iliac artery stenosis, celiac artery     stenosis, and superior mesenteric artery stenosis. 4. Healthcare-acquired pneumonia. 5. Acute renal failure secondary to prerenal azotemia, resolved. 6. Hypertension. 7. Tobacco use disorder. 8. Tachybrady syndrome, status post pacemaker. 9. History of syncope.  Studies during this hospitalization include the following: 1. CT of the abdomen and pelvis done on admission, which shows normal     wall thickening in the duodenum and jejunum suggesting enteritis.     Also, unlikely infectious ischemic enteritis is not totally     excluded, given the degree of stenosis of the celiac trunk and     segmental chronic thrombosis, proximal superior mesenteric artery.     Impression:     a.     Stable right renal atrophy likely the right renal artery      atherosclerosis.     b.     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     c.     Mild increased in prominence of the irregular mural thrombus      in the lower thoracic aorta.  There is considerable      atherosclerosis of the abdominal aorta with infrarenal ectasia      with no overt aneurysm. 2. Portable chest x-ray on May 3rd, which showed cardiomegaly with     development of mild pulmonary venous congestion.  Small left and     probable trace right pleural effusion.  Patchy left airspace     disease, which could represent atelectasis  or early infection. 3. A 2-D echocardiogram done on May 1st, which shows left ventricular     cavity size normal with mild concentric hypertrophy.  Systolic     function preserved with an ejection fraction in the range of 55% to     70%.  Wall motion is normal with no regional wall motion     abnormalities and Doppler parameters are consistent with grade 1     diastolic dysfunction. 4. Ankle-brachial indices done on May 1st, which shows right ABIs     indicate a mild reduction of arteriosclerosis.  Left ABIs indicate     severe abduction of arterial flow.  PROCEDURES: Right common femoral artery cannulation under ultrasound guidance and aortogram.  Findings show bilateral renal artery stenosis, 75% calcified stenosis of the left common iliac artery and proximal occlusions of celiac and superior mesenteric artery.  CONSULTANTS: 1. Dr. Imogene Burn, Vascular Surgery. 2. Dr. Armanda Magic, Cardiology.  CHIEF COMPLAINT: Weakness, inability to eat.  HISTORY OF PRESENT ILLNESS:  Please refer to the H and P by Dr. Selena Batten for details of the HPI.  HOSPITAL COURSE: 1. Abdominal pain and inability to eat.  The patient is having     abdominal pain, colicky-type pain with any type of eating.  It was     determined that she is suffering from this mesenteric ischemia.     Vascular Surgery was consulted and the patient was evaluated by Dr.     Imogene Burn.  Meanwhile, the patient was made n.p.o. and then placed on a     liquid diet.  Once the patient underwent aortogram.  She was     resumed on heart healthy diet, which she tolerated well.  Thus far     the patient has had no further nausea, vomiting, or diarrhea. 2. Severe peripheral arterial disease with bilateral renal artery     stenosis.  Left common iliac artery stenosis and celiac artery     stenosis, and superior mesenteric artery stenosis.  On CT scan, it     showed that the patient had significant stenosis and calcification.     Dr. Imogene Burn from Vascular  Surgery was consulted and ABIs were done     confirming significant decrease flow to the left lower extremity.     Additionally, clinically, the patient was having pain in the left     lower extremity.  Dr. Imogene Burn did an aortogram on the patient and the     findings were as noted above.  After a long discussion with the     family, the patient is scheduled to have celiac artery stenting and     possibly more extensive stenting based upon the findings at the     time of the procedure.  Cardiology was asked to see the patient for     risk stratification.  They felt that as long as the patient was     being stented, there is no need for any further cardiac evaluation.     There would only be need for further risk transfusion if the     patient is having no procedure.  The patient is scheduled to have     stenting done on Jul 30, 2010. 3. Healthcare-acquired pneumonia.  The patient during her     hospitalization, had episodes of hypoxemia.  On chest x-ray, there     are findings consistent with left pneumonia, which developed more     than 4 days into her hospitalization.  The patient was treated for     healthcare-acquired pneumonia and then antibiotics tapered down and     left them.  The patient has completed her full course of treatment     on today, Jul 29, 2010 and no further treatment is indicated. 4. Acute renal failure, which is secondary to prerenal azotemia.  The     patient was rehydrated and the renal failure resolved.  In terms of     her other chronic disorders including tachybrady syndrome, the     patient had no sequelae during the hospital and she does have a     pacemaker in place for capture. 5. Hypertension, well controlled in this hospitalization. 6. Tobacco use disorder, treated with nicotine patch.  This brings the patient up-to-date on her hospital course.  Please see the progress note written for her physical examination from today.     Altha Harm,  MD     MAM/MEDQ  D:  07/30/2010  T:  07/30/2010  Job:  [id number]  Electronically Signed by Marthann Schiller MD on 08/21/2010 09:38:48 AM

## 2010-08-25 ENCOUNTER — Encounter: Payer: Self-pay | Admitting: Vascular Surgery

## 2010-08-29 ENCOUNTER — Encounter: Payer: Self-pay | Admitting: Vascular Surgery

## 2010-08-29 ENCOUNTER — Other Ambulatory Visit (INDEPENDENT_AMBULATORY_CARE_PROVIDER_SITE_OTHER): Payer: Medicare Other

## 2010-08-29 ENCOUNTER — Encounter (INDEPENDENT_AMBULATORY_CARE_PROVIDER_SITE_OTHER): Payer: Medicare Other

## 2010-08-29 ENCOUNTER — Ambulatory Visit (INDEPENDENT_AMBULATORY_CARE_PROVIDER_SITE_OTHER): Payer: Medicare Other | Admitting: Vascular Surgery

## 2010-08-29 DIAGNOSIS — I779 Disorder of arteries and arterioles, unspecified: Secondary | ICD-10-CM | POA: Insufficient documentation

## 2010-08-29 DIAGNOSIS — I739 Peripheral vascular disease, unspecified: Secondary | ICD-10-CM

## 2010-08-29 DIAGNOSIS — I701 Atherosclerosis of renal artery: Secondary | ICD-10-CM

## 2010-08-29 DIAGNOSIS — K551 Chronic vascular disorders of intestine: Secondary | ICD-10-CM

## 2010-08-29 DIAGNOSIS — Z48812 Encounter for surgical aftercare following surgery on the circulatory system: Secondary | ICD-10-CM

## 2010-08-29 NOTE — Progress Notes (Signed)
[facility] [place]  Established Intermittent Claudication  History of Present Illness  [name] [name] is a 77 y.o. year old female who presents with chief complaint: continued Left foot numbness.  The patient's left leg symptoms have improved except for continued L lower leg numbness and intermittent pain.   The patient's treatment regimen currently included: maximal medical management and Plavix.  The patient's abdominal symptoms from her known celiac and SMA occlusive disease are improved.  The patient has gained 3 pounds with additional of Megace.  She still have early satiety without food fear or post-prandial pain.  Diarrhea is completely resolved.  She also returns for follow up for her B renal artery duplex.  Past Medical History, Past Surgical History, Social History, Family History, Medications, Allergies, and Review of Systems are unchanged from previous evaluation on 08/15/10.  Physical Examination  Filed Vitals:   08/29/10 1110  BP: 155/81  Pulse: 89  Resp: 16    General: A&O x 3, WD, elderly, cachectic   Pulmonary: Sym exp, good air movt, CTAB, no rales, rhonchi, & wheezing  Cardiac: RRR, Nl S1, S2, no Murmurs, rubs or gallops,   Vascular:   R arm: palpable, L arm: palpable  R carotid: palpable without bruit, L carotid: palpable without bruit  Abd. Aorta: non-palpable  R femoral: palpable, L femoral: palpable  R popliteal: non-palpable, L popliteal: non-palpable  R DP: non-palpable, L DP: non-palpable  R PT: non-palpable, L PT: non-palpable  Gastrointestinal: soft, NTND, -G/R, - HSM, - masses, - CVAT B  Musculoskeletal: M/S 5/5 throughout, Extremities without ischemic changes  Neurologic: Pain and light touch intact in extremities , Motor exam as listed above  Non-Invasive Vascular Imaging ABI (Date: 08/29/10)  RLE: 0.98 (prev 0.88)  LLE: 0.66 (prev 0.29)  Aorto-iliac duplex (Date 08/29/10)  Patent aorta and L CIA  stent  Bilateral renal duplex (Date 08/29/10)  Atrophied R kidney with >60% RAS  Normal sized L kidney (10.3 cm) with RI 0.77 and RAS >60%  Medical Decision Making  [name] [name] is a 77 y.o. year old female who presents with: extensive atherosclerotic disease in multiple tissue beds.    L leg perfusion is improved as evident by doubling of L ABI.  Pt's residual sx like from either ischemic neuropathy or other etiologies.  Pt is not a bypass operation candidate.    Her abdominal complaints are stable and the pt is gaining wt.  She also is not a candidate for intervention on her mesenteric vessels due to her cardiac status.  Her renal duplex demonstrates a right kidney that cannot be salvaged and a left kidney that can be possibly salvaged with a >60% stenosis in the renal artery.  I have offered her a right renal artery angiogram and possible stenting to try to salvage the left kidney and help prevent progression onto renal failure.  This is scheduled for 28th June 2012.  I discussed in depth with the patient the nature of atherosclerosis, and emphasized the importance of maximal medical management including strict control of blood pressure, blood glucose, and lipid levels, obtaining regular exercise, and cessation of smoking.  The patient is aware that without maximal medical management the underlying atherosclerotic disease process will progress, limiting the benefit of any interventions.  Thank you for allowing [place] to participate in this patient's care.  Leonides Sake, MD [facility] Pager: [phone]

## 2010-08-31 NOTE — H&P (Signed)
[name] [name], [name] [name]            ACCOUNT NO.:  [id number]  MEDICAL RECORD NO.:  [id number]           PATIENT TYPE:  E  LOCATION:  [facility]LED                         FACILITY:  [facility]  PHYSICIAN:  Massie Maroon, MD        DATE OF BIRTH:  1923/06/20  DATE OF ADMISSION:  07/20/2010 DATE OF DISCHARGE:                             HISTORY & PHYSICAL   CHIEF COMPLAINT:  "I am weak, I cannot eat."  HISTORY OF PRESENT ILLNESS:  This is an 77 year old female with a history of hypertension, apparently complains of being unable to eat for 2 weeks.  She apparently has epigastric discomfort when she eats.  The patient had some diarrhea last night and felt so weak that her daughter brought her to the ER this morning.  The patient denies any fever, chills, nausea, vomiting, constipation, bright red blood per rectum, or black stool.  The patient was evaluated in the emergency room because she was thought to have abdominal angina.  Dr. Imogene Burn from Vascular Surgery apparently came by to evaluate her and wants to do an angiogram on Thursday.  I am not sure why he wants to delay 4 or 5 days before he performs an angiogram.  Regardless, we will admit the patient for dehydration as well as workup of the epigastric pain.  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  The patient as stated above will be admitted for workup of abdominal pain, possibly abdominal angina.  PAST MEDICAL HISTORY: 1. Hypertension. 2. Chronic tobacco abuse. 3. History of SVT. 4. Tachybrady syndrome. 5. Syncope.  PAST SURGICAL HISTORY:  Appendectomy, hysterectomy, dual-chamber pacing pacemaker placement in September 2006.  SOCIAL HISTORY:  The patient is  widowed.  She moved to [place] from [place] previously and lived in [place] for some time before moving to [place] and then back to [facility] where she lives now at the present time.  Her second husband died in 1998-08-13.  She lives with her daughter when she is in town.  She has a very supportive family.  There is no history of alcohol or tobacco use.  FAMILY HISTORY:  Her father died of pneumoconiosis at age 15.  Mother died at age 65 from renal failure.  Her sister died at age 65 from brain tumor.  REVIEW OF SYSTEMS:  The patient states that she had a colonoscopy in Aug 13, 1998 and it was negative.  REVIEW OF SYSTEMS:  Otherwise negative for all 10 organ systems except for pertinent positives stated above.  ALLERGIES:  No known drug allergies.  MEDICATIONS: 1. Atenolol 50 mg p.o. daily. 2. Losartan 100 mg p.o. daily. 3. Hydrochlorothiazide 12.5 mg p.o. daily.  PHYSICAL EXAMINATION:  VITAL SIGNS:  Temperature 97.4, pulse 82, blood pressure 134/80, pulse ox is 96% on room air. HEENT:  Anicteric. NECK:  No JVD, no bruit. HEART:  Regular rate and rhythm.  S1, S2.  No murmurs, gallops, or rubs. LUNGS:  Clear to auscultation bilaterally. ABDOMEN:  Soft, nontender, nondistended.  Positive bowel sounds. EXTREMITIES:  No cyanosis, clubbing, or edema. SKIN:  No rashes. LYMPH NODES:  No adenopathy. NEURO:  Nonfocal.  LABORATORY DATA:  Lipase 22, sodium 133, potassium 4.0, BUN 40, creatinine 1.27, AST 11, ALT 11, alk phos 83, total bilirubin 0.6, troponin I less than 0.05.  WBC 15.0, hemoglobin 14.0, platelet count 192.  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  There is stable right renal atrophy likely arising from  right renal artery atherosclerosis.  There is sclerosis in the right iliac bone and ischium possibly from prior radiation therapy or burned out Paget's.  There is mild increase in the prominence of regular mural thrombus in the lower aorta and considerable atherosclerosis to the abdominal aorta with infrarenal ectasia, but no overt aneurysm.  ASSESSMENT/PLAN: 1. Abdominal pain possibly secondary to abdominal angina:  The patient     will remain n.p.o.  We will increase Protonix to 40 mg p.o. b.i.d.     Vascular consult to rule out SMA or IMA stenosis. 2. Hypertension.  Continue atenolol.  Hold losartan and     hydrochlorothiazide in light of the fact that she has some mild     renal insufficiency most likely due to dehydration. 3. Diarrhea.  We will observe and if she continues to have diarrhea,     we will obtain stool studies for fecal leukocytes, culture, C     difficile, ova and parasites. 4. Tobacco dependence:  The patient was counseled for 10 minutes on     smoking cessation.  She would rather continue to smoke. 5. Deep vein thrombosis prophylaxis.  SCDs.     Massie Maroon, MD     JYK/MEDQ  D:  07/20/2010  T:  07/20/2010  Job:  [id number]  cc:   Percell Locus Fax: [phone]  Electronically Signed by Pearson Grippe MD on 08/31/2010 07:27:33 PM

## 2010-09-08 ENCOUNTER — Ambulatory Visit (HOSPITAL_COMMUNITY): Payer: Medicare Other

## 2010-09-08 ENCOUNTER — Inpatient Hospital Stay (HOSPITAL_COMMUNITY)
Admission: RE | Admit: 2010-09-08 | Discharge: 2010-09-12 | DRG: 674 | Disposition: A | Discharge status: Home / Self Care | Payer: Medicare Other | Source: Ambulatory Visit | Attending: Vascular Surgery | Admitting: Vascular Surgery

## 2010-09-08 DIAGNOSIS — D500 Iron deficiency anemia secondary to blood loss (chronic): Secondary | ICD-10-CM | POA: Diagnosis present

## 2010-09-08 DIAGNOSIS — K2990 Gastroduodenitis, unspecified, without bleeding: Secondary | ICD-10-CM | POA: Diagnosis present

## 2010-09-08 DIAGNOSIS — I701 Atherosclerosis of renal artery: Secondary | ICD-10-CM

## 2010-09-08 DIAGNOSIS — Z950 Presence of cardiac pacemaker: Secondary | ICD-10-CM

## 2010-09-08 DIAGNOSIS — F17200 Nicotine dependence, unspecified, uncomplicated: Secondary | ICD-10-CM | POA: Diagnosis present

## 2010-09-08 DIAGNOSIS — K2970 Gastritis, unspecified, without bleeding: Secondary | ICD-10-CM | POA: Diagnosis present

## 2010-09-08 DIAGNOSIS — K551 Chronic vascular disorders of intestine: Secondary | ICD-10-CM | POA: Diagnosis present

## 2010-09-08 LAB — POCT I-STAT, CHEM 8
Calcium, Ion: 1.29 mmol/L (ref 1.12–1.32)
Chloride: 112 mEq/L (ref 96–112)
Creatinine, Ser: 1 mg/dL (ref 0.50–1.10)
Glucose, Bld: 89 mg/dL (ref 70–99)
HCT: 32 % — ABNORMAL LOW (ref 36.0–46.0)
Hemoglobin: 10.9 g/dL — ABNORMAL LOW (ref 12.0–15.0)

## 2010-09-08 LAB — POCT I-STAT 4, (NA,K, GLUC, HGB,HCT)
Glucose, Bld: 84 mg/dL (ref 70–99)
HCT: 31 % — ABNORMAL LOW (ref 36.0–46.0)
Sodium: 141 mEq/L (ref 135–145)

## 2010-09-08 NOTE — Procedures (Unsigned)
RENAL ARTERY DUPLEX EVALUATION  INDICATION:  Bilateral renal artery stenosis by CT.  HISTORY: Diabetes:  No. Cardiac:  No. Hypertension:  Yes. Smoking:  Previous.  RENAL ARTERY DUPLEX FINDINGS:  Aorta-Proximal:  81 cm/s Aorta-Mid:  70 cm/s Aorta-Distal:  78 cm/s Celiac Artery Origin:  Not visualized SMA Origin:  Not visualized                                   RIGHT               LEFT Renal Artery Origin:             Not visualized      Not visualized Renal Artery Proximal:           586 cm/s            263 cm/s Renal Artery Mid:                152 cm/s            55 cm/s Renal Artery Distal:             Not visualized      64 cm/s Hilar Acceleration Time (AT): Renal-Aortic Ratio (RAR):        8.4                 3.8 Kidney Size:                     6.3 cm              10.3 cm End Diastolic Ratio (EDR): Resistive Index (RI):            0.65                0.77  IMPRESSION: 1. Bilateral renal aortic ratios suggest >60% stenosis of the     bilateral proximal renal arteries, based on limited visualization. 2. There is  an abnormal right kidney length measurement and left     intrarenal resistive index noted.  The left kidney length     measurement and right intrarenal resistive index are within normal     limits. 3. Unable to adequately visualize the celiac artery, superior     mesenteric artery, right distal renal artery and bilateral renal     artery origins due to overlying bowel gas patterns and acoustic     shadowing.  ___________________________________________ Fransisco Hertz, MD  CH/MEDQ  D:  09/01/2010  T:  09/01/2010  Job:  [id number]

## 2010-09-08 NOTE — Procedures (Unsigned)
AORTA-ILIAC DUPLEX EVALUATION  INDICATION:  Left common iliac artery stent.  HISTORY: Diabetes:  No. Cardiac:  No. Hypertension:  Yes. Smoking:  Previous. Previous Surgery:  Left common iliac artery stent on 07/30/2010.              SINGLE LEVEL ARTERIAL EXAM                             RIGHT                  LEFT Brachial:                  157                    166 Anterior tibial:           162                    109 Posterior tibial:          151                    35 Peroneal:                                         69 Ankle/brachial index:      0.98                   0.66 Previous ABI/date:  AORTA-ILIAC DUPLEX EXAM Aorta - Proximal     81 cm/s Aorta - Mid          70 cm/s Aorta - Distal       78 cm/s  RIGHT                                   LEFT                   CIA-PROXIMAL          195 cm/s                   CIA-DISTAL            162 cm/s                   HYPOGASTRIC                   EIA-PROXIMAL          123 cm/s                   EIA-MID                   EIA-DISTAL  IMPRESSION: 1. Patent left common iliac artery stent with no hemodynamically     significant increase in velocity noted, based on limited     visualization due to overlying bowel gas patterns and acoustic     shadowing. 2. The bilateral ankle brachial indices and Doppler waveforms suggest     normal perfusion of the right lower extremity and moderately     decreased perfusion of the left lower extremity.  ___________________________________________ Fransisco Hertz, MD  CH/MEDQ  D:  08/29/2010  T:  08/29/2010  Job:  [id number]

## 2010-09-09 ENCOUNTER — Inpatient Hospital Stay (HOSPITAL_COMMUNITY): Payer: Medicare Other

## 2010-09-09 LAB — BASIC METABOLIC PANEL
BUN: 23 mg/dL (ref 6–23)
CO2: 23 mEq/L (ref 19–32)
Calcium: 9.2 mg/dL (ref 8.4–10.5)
Chloride: 114 mEq/L — ABNORMAL HIGH (ref 96–112)
Creatinine, Ser: 0.94 mg/dL (ref 0.50–1.10)

## 2010-09-09 LAB — CBC
HCT: 23.8 % — ABNORMAL LOW (ref 36.0–46.0)
MCH: 29.6 pg (ref 26.0–34.0)
MCV: 92.6 fL (ref 78.0–100.0)
Platelets: 267 10*3/uL (ref 150–400)
RBC: 2.57 MIL/uL — ABNORMAL LOW (ref 3.87–5.11)
WBC: 14.3 10*3/uL — ABNORMAL HIGH (ref 4.0–10.5)

## 2010-09-09 LAB — ABO/RH: ABO/RH(D): O POS

## 2010-09-09 LAB — CARDIAC PANEL(CRET KIN+CKTOT+MB+TROPI)
CK, MB: 1 ng/mL (ref 0.3–4.0)
CK, MB: 0.9 ng/mL (ref 0.3–4.0)
Relative Index: INVALID (ref 0.0–2.5)
Total CK: 14 U/L (ref 7–177)
Troponin I: <0.3 ng/mL (ref <0.30)

## 2010-09-09 LAB — HEMOGLOBIN AND HEMATOCRIT, BLOOD: Hemoglobin: 7.7 g/dL — ABNORMAL LOW (ref 12.0–15.0)

## 2010-09-10 ENCOUNTER — Inpatient Hospital Stay (HOSPITAL_COMMUNITY): Payer: Medicare Other

## 2010-09-10 LAB — CBC
MCH: 29.9 pg (ref 26.0–34.0)
MCHC: 34 g/dL (ref 30.0–36.0)
MCV: 88 fL (ref 78.0–100.0)
Platelets: 208 10*3/uL (ref 150–400)
RDW: 14.9 % (ref 11.5–15.5)
WBC: 14.9 10*3/uL — ABNORMAL HIGH (ref 4.0–10.5)

## 2010-09-10 LAB — CROSSMATCH: Unit division: 0

## 2010-09-10 LAB — BASIC METABOLIC PANEL
BUN: 19 mg/dL (ref 6–23)
CO2: 20 mEq/L (ref 19–32)
Chloride: 106 mEq/L (ref 96–112)
Creatinine, Ser: 0.95 mg/dL (ref 0.50–1.10)
Glucose, Bld: 71 mg/dL (ref 70–99)
Potassium: 3.5 mEq/L (ref 3.5–5.1)

## 2010-09-10 LAB — CARDIAC PANEL(CRET KIN+CKTOT+MB+TROPI): Troponin I: <0.3 ng/mL (ref <0.30)

## 2010-09-10 MED ORDER — TECHNETIUM TC 99M SULFUR COLLOID FILTERED
2.0000 | Freq: Once | INTRAVENOUS | Status: AC | PRN
  Administered 2010-09-10: 2 via INTRADERMAL

## 2010-09-10 NOTE — Op Note (Signed)
[name] [name], [name] [name]            ACCOUNT NO.:  [id number]  MEDICAL RECORD NO.:  [id number]  LOCATION:  2005                         FACILITY:  [facility]  PHYSICIAN:  Fransisco Hertz, MD       DATE OF BIRTH:  29-Oct-1923  DATE OF PROCEDURE:  09/08/2010 DATE OF DISCHARGE:                              OPERATIVE REPORT   PROCEDURES: 1. Right common femoral artery cannulation under ultrasound guidance. 2. Left renal artery selection. 3. Left renal artery angiogram. 4. Left renal artery stenting.  PREOPERATIVE DIAGNOSIS:  Bilateral renal artery stenosis.  POSTOPERATIVE DIAGNOSIS:  Bilateral renal artery stenosis.  SURGEON:  Fransisco Hertz, MD  ANESTHESIA:  Conscious sedation.  ESTIMATED BLOOD LOSS:  Minimal.  CONTRAST:  60 mL.  SPECIMENS:  None.  FINDINGS IN THIS CASE: 1. Left renal artery stenosis greater than 75% that resolved after     stenting. 2. Right renal artery stenosis greater than 90%.  INDICATIONS:  This is an 77 year old patient well known to me, multiple areas of atherosclerotic disease.  She has bilateral renal artery stenosis found on previous angiograms.  Unfortunately due to her chronic renal insufficiency, we were not able to address all of her disease at the same time and the plan at this sitting was to try to attempt to address the left renal artery.  Unfortunately, on a renal duplex, the right kidney already shrunk down to less than 7 cm, so there is no benefit trying to revascularize this.  However, the left renal remained greater than 10 cm and I felt based on the previous imaging that it might be possible to gain access to this and stent this artery open as an effort to basically preserve her renal function as the right kidney is demonstrating evidence of shrinkage due to the right renal artery stenosis.  The patient is aware of the risks of this procedure including bleeding, infection, possible access complications, possible rupture of the left renal  artery or parenchyma, and possible embolization.  She is aware of these risks and agreed to proceed forward.  DESCRIPTION OF OPERATION:  After full informed written consent was obtained from the patient, she was brought back to the angio suite and placed supine upon angio table.  She was then connected to monitoring equipment prior to giving conscious sedation.  Then, she was given conscious sedation amounts of which are documented in the chart.  She was then prepped and draped in a standard fashion for aortogram and renal intervention.  We turned our attention to the right groin.  Under ultrasound guidance, I cannulated the right common femoral artery with a micropuncture needle and passed a microwire into the iliac system.  The needle was then exchanged for a micro-sheath.  The wire was then exchanged for a Bentson wire which was advanced the level of L1.  At this point, then the sheath was exchanged for a 6-French sheath which was advanced in the common femoral artery.  A JR-4 guide was advanced over the wire to the level across from the left renal artery.  The wire was then exchanged for a 0.014" stabilizer wire which was used with the guide to select the left renal artery.  It was able to advance this out  past the first bifurcation point and used this to stabilize the platform.   I lodged the guide into the left renal artery orifice and did a hand injection  and this demonstrated selection of the left renal artery and also demonstrated  a >75% stenosis of the renal artery on the left side.  At this point, I did  measurements on the residual renal artery and also the distal renal artery and  felt that for this patient's geometry the best stent would be a 6-mm x 12-mm  balloon expandable stent.  We loaded this over the wire and using the guide helped  to deliver this into the artery.  We then backed off the guide and then pulled the stent back, so it was within the aorta.   Unfortunately, due to the angulation of takeoff here, there likely would be a slight bird- beaking affect at the superior aspect of this renal artery, but this was unable to be avoided due to this patient's angulation.  I did another hand injection to verify the positioning of the stent and then this was inflated to profile and then I pulled back the balloon to flare the stent at the orifice of this artery.  This allowed [place] to flare the stent slightly,  after deploying it to profile, the inferior aspect of this stent was well apposed to its orifice inferiorly.  At the superior, however, there was about a 1-2 mm gap which was not avoidable given the geometry in this patient's case.  At this point, I pulled out the balloon and did a completion angiogram.  This demonstrated complete resolution of the previous renal artery stenosis.  We then pulled back the guide and wire together and pulled it out of the sheath.  We aspirated the sheath.  There were no clots and then loaded the sheath with heparinized saline.  Please note I did give this patient initially 4000 units  of heparin after placement of the guide catheter.  We checked an ACT which was not  therapeutic,  so she got additional 2000 heparin prior to placing the stent.  The  plan at this point is to allow her anticoagulation to reverse naturally and then pull the sheath in the holding area.  COMPLICATIONS:  None.  CONDITION:  Stable.     Fransisco Hertz, MD     BLC/MEDQ  D:  09/08/2010  T:  09/09/2010  Job:  [id number]  Electronically Signed by Leonides Sake MD on 09/10/2010 09:15:25 AM

## 2010-09-11 ENCOUNTER — Other Ambulatory Visit: Payer: Self-pay | Admitting: Gastroenterology

## 2010-09-11 LAB — CBC
HCT: 28.8 % — ABNORMAL LOW (ref 36.0–46.0)
Hemoglobin: 9.7 g/dL — ABNORMAL LOW (ref 12.0–15.0)
MCH: 30 pg (ref 26.0–34.0)
MCHC: 33.7 g/dL (ref 30.0–36.0)
MCV: 89.2 fL (ref 78.0–100.0)
RDW: 14.9 % (ref 11.5–15.5)

## 2010-09-11 LAB — HEPATIC FUNCTION PANEL
Alkaline Phosphatase: 53 U/L (ref 39–117)
Bilirubin, Direct: 0.3 mg/dL (ref 0.0–0.3)
Total Bilirubin: 0.7 mg/dL (ref 0.3–1.2)

## 2010-09-11 LAB — LIPASE, BLOOD: Lipase: 11 U/L (ref 11–59)

## 2010-09-11 LAB — AMYLASE: Amylase: 36 U/L (ref 0–105)

## 2010-09-12 LAB — CBC
Hemoglobin: 9.4 g/dL — ABNORMAL LOW (ref 12.0–15.0)
MCH: 30.3 pg (ref 26.0–34.0)
MCHC: 34.1 g/dL (ref 30.0–36.0)
RDW: 14.6 % (ref 11.5–15.5)

## 2010-09-15 NOTE — Discharge Summary (Addendum)
[name] [name], [name] [name]            ACCOUNT NO.:  [id number]  MEDICAL RECORD NO.:  [id number]  LOCATION:  2005                         FACILITY:  [facility]  PHYSICIAN:  Fransisco Hertz, MD       DATE OF BIRTH:  12/25/1923  DATE OF ADMISSION:  09/08/2010 DATE OF DISCHARGE:  09/12/2010                              DISCHARGE SUMMARY   CHIEF COMPLAINT:  Left renal artery stenosis.  HISTORY OF PRESENT ILLNESS:  [name] [name] is an 77 year old woman with known severe cardiac disease and chronic mesenteric ischemia who had a string at his right aorta and left common carotid artery stenosis that was stented.  She had severe bilateral renal artery stenosis.  The right kidney had atrophied and the intervention was not going to be useful. She also had a left renal artery, which was stenotic and was felt that this might benefit from stenting.  At this point, the patient notes she still had poor appetite.  She does not have any pain with eating and her weight has been fairly stable.  In her left leg, she felt continued numbness in the calf, but she denies any frank rest pain.  It was felt that she should have renal artery stenting.  PAST MEDICAL HISTORY: 1. Abdominal pain secondary to chronic mesenteric ischemia. 2. Severe peripheral artery disease status post left common iliac     artery stent. 3. History of pneumonia. 4. History of respiratory failure in the past and renal failure, which     both resolved. 5. History of tachybrady syndrome with pacemaker placement. 6. Tobacco abuse.  HOSPITAL COURSE:  The patient was taken to the E-lab on September 08, 2010, for a left common femoral artery cannulation under ultrasound guidance, left renal artery selection, left renal artery angiogram, and left renal artery stenting by Dr. Imogene Burn.  Postoperatively, the patient did well. Her abdomen was soft, but she still complained of epigastric pain extending into the substernal area.  Her EKG showed no acute  changes and her cardiac enzymes were negative.  GI was consulted and she had a gastric emptying study, which was delayed.  They also did an endoscopy, which showed some mild gastritis and erosions and biopsies were taken for H. pylori.  Her Protonix was increased to b.i.d. and her aspirin was discontinued.  She was able to eat.  Her abdomen was soft.  She otherwise is feeling well and she was voiding without difficulty.  Her creatinine is 0.94.  Her lipase was 11 and her amylase was 36 and her hepatic function was within normal limits.  She had blood loss anemia with hemoglobin of 7.6 and hematocrit 23.8.  She was given 2 units of packed cells and her CBC was 9.4 and 27.6 at discharge.  FINAL DIAGNOSES: 1. Left renal artery stenosis status post stenting. 2. Delayed gastric emptying and GI consult and endoscopy, which showed     some gastritis and erosions in the stomach.  All of her other medical issues were stable while she was in-house.  DISPOSITION:  The patient was discharged to home.  She will follow up with Dr. [name] in 4 weeks and with Dr. Imogene Burn in 2 weeks.  DISCHARGE MEDICATIONS:  1. Reglan 1 tablet three times a day with meals. 2. Protonix 40 mg twice daily. 3. Atenolol 81 mg daily.  She will stop her aspirin. 4. She will continue Plavix 75 mg daily with a meal. 5. Losartan 100 mg daily. 6. Megestrol oral suspension 4 teaspoons daily. 7. Nicotine patch 7 mg daily.     Della Goo, PA-C   ______________________________ Fransisco Hertz, MD    RR/MEDQ  D:  09/12/2010  T:  09/12/2010  Job:  [id number]  Electronically Signed by Leonides Sake MD on 09/15/2010 07:31:38 AM Electronically Signed by Della Goo PA on 09/16/2010 02:27:54 PM

## 2010-10-01 NOTE — Progress Notes (Deleted)
[facility] [place]  Postoperative Visit  History of Present Illness  [name] [name] is a 77 y.o. female who presents for postoperative follow-up for:   Date: 09/08/10.  1. Right common femoral artery cannulation under ultrasound guidance.  2. Left renal artery selection.  3. Left renal artery angiogram.  4. Left renal artery stenting.   The patient's wounds are *** healed.  The patient notes *** resolution of lower extremity symptoms.  The patient is *** able to complete their activities of daily living.  The patient's current symptoms are: ***.  Physical Examination  There were no vitals filed for this visit.  General: A&O x 3, WDWN, *** Cachectic / Ill appear / Somulent  Pulmonary: Sym exp, good air movt, CTAB, no rales, rhonchi, & wheezing, *** + rales / + rhonchi / + wheezing  Cardiac: RRR, Nl S1, S2, no Murmurs, rubs or gallops, *** S3/S4, Irregularly, irregular rhythm and rate  Vascular: Vessel Right Left  Radial Palpable Palpable  Ulnary Palpable Palpable  Brachial Palpable Palpable  Carotid Palpable, without bruit Palpable, without bruit  Aorta Non-palpable N/A  Femoral Palpable Palpable  Popliteal Non-palpable Non-palpable  PT Palpable Palpable  DP Palpable Palpable   Gastrointestinal: soft, NTND, -G/R, - HSM, - masses, - CVAT B, *** + AAA / Surg. Inc TTP: (RUQ / RLQ / LUQ / LLQ / Epigastric), +G / +R  Musculoskeletal: M/S 5/5 throughout except ***, Extremities without ischemic changes except  ***, *** groin without hematoma  Neurologic: CN 2-12 intact *** except ***, Pain and light touch intact in extremities *** except ***, Motor exam as listed above  Medical Decision Making  [name] [name] is a 77 y.o. female who presents s/p L RAS. I discussed in depth with the patient the nature of atherosclerosis, and emphasized the importance of maximal medical management including strict control of blood pressure, blood glucose, and lipid  levels, obtaining regular exercise, and cessation of smoking.  The patient is aware that without maximal medical management the underlying atherosclerotic disease process will progress, limiting the benefit of any interventions. The patient's surveillance will included ABI and bypass duplex studies which will be completed in: *** months, at which time the patient will be re-evaluated.   I emphasized the importance of routine surveillance of the patient's bypass, as the vascular surgery literature emphasizes the improved patency possible with assisted primary patency procedures versus secondary patency procedures. The patient agrees to participate in their maximal medical care and routine surveillance.  Thank you for allowing [place] to participate in this patient's care.  Leonides Sake, MD [facility] [place] Office: [phone] Pager: [phone]

## 2010-10-02 ENCOUNTER — Emergency Department (HOSPITAL_COMMUNITY): Payer: Medicare Other

## 2010-10-02 ENCOUNTER — Inpatient Hospital Stay (HOSPITAL_COMMUNITY)
Admission: EM | Admit: 2010-10-02 | Discharge: 2010-10-08 | DRG: 897 | Disposition: A | Discharge status: Skilled Nursing Facility | Payer: Medicare Other | Attending: Internal Medicine | Admitting: Internal Medicine

## 2010-10-02 DIAGNOSIS — T450X5A Adverse effect of antiallergic and antiemetic drugs, initial encounter: Secondary | ICD-10-CM | POA: Diagnosis present

## 2010-10-02 DIAGNOSIS — R5383 Other fatigue: Secondary | ICD-10-CM | POA: Diagnosis present

## 2010-10-02 DIAGNOSIS — R5381 Other malaise: Secondary | ICD-10-CM | POA: Diagnosis present

## 2010-10-02 DIAGNOSIS — Z950 Presence of cardiac pacemaker: Secondary | ICD-10-CM

## 2010-10-02 DIAGNOSIS — J449 Chronic obstructive pulmonary disease, unspecified: Secondary | ICD-10-CM | POA: Diagnosis present

## 2010-10-02 DIAGNOSIS — M479 Spondylosis, unspecified: Secondary | ICD-10-CM | POA: Diagnosis present

## 2010-10-02 DIAGNOSIS — R269 Unspecified abnormalities of gait and mobility: Secondary | ICD-10-CM | POA: Diagnosis present

## 2010-10-02 DIAGNOSIS — Z8711 Personal history of peptic ulcer disease: Secondary | ICD-10-CM

## 2010-10-02 DIAGNOSIS — F19921 Other psychoactive substance use, unspecified with intoxication with delirium: Principal | ICD-10-CM | POA: Diagnosis present

## 2010-10-02 DIAGNOSIS — R630 Anorexia: Secondary | ICD-10-CM | POA: Diagnosis present

## 2010-10-02 DIAGNOSIS — F329 Major depressive disorder, single episode, unspecified: Secondary | ICD-10-CM | POA: Diagnosis present

## 2010-10-02 DIAGNOSIS — R627 Adult failure to thrive: Secondary | ICD-10-CM | POA: Diagnosis present

## 2010-10-02 DIAGNOSIS — I998 Other disorder of circulatory system: Secondary | ICD-10-CM | POA: Diagnosis present

## 2010-10-02 DIAGNOSIS — I739 Peripheral vascular disease, unspecified: Secondary | ICD-10-CM | POA: Diagnosis present

## 2010-10-02 DIAGNOSIS — N289 Disorder of kidney and ureter, unspecified: Secondary | ICD-10-CM | POA: Diagnosis present

## 2010-10-02 DIAGNOSIS — I150 Renovascular hypertension: Secondary | ICD-10-CM | POA: Diagnosis present

## 2010-10-02 DIAGNOSIS — J4489 Other specified chronic obstructive pulmonary disease: Secondary | ICD-10-CM | POA: Diagnosis present

## 2010-10-02 DIAGNOSIS — I701 Atherosclerosis of renal artery: Secondary | ICD-10-CM | POA: Diagnosis present

## 2010-10-02 DIAGNOSIS — F3289 Other specified depressive episodes: Secondary | ICD-10-CM | POA: Diagnosis present

## 2010-10-02 LAB — URINALYSIS, ROUTINE W REFLEX MICROSCOPIC
Glucose, UA: NEGATIVE mg/dL
Ketones, ur: NEGATIVE mg/dL
Protein, ur: 30 mg/dL — AB
Urobilinogen, UA: 1 mg/dL (ref 0.0–1.0)

## 2010-10-02 LAB — DIFFERENTIAL
Basophils Absolute: 0 10*3/uL (ref 0.0–0.1)
Basophils Relative: 0 % (ref 0–1)
Eosinophils Absolute: 0.1 10*3/uL (ref 0.0–0.7)
Lymphs Abs: 1.2 10*3/uL (ref 0.7–4.0)
Monocytes Absolute: 1.9 10*3/uL — ABNORMAL HIGH (ref 0.1–1.0)
Neutro Abs: 11.6 10*3/uL — ABNORMAL HIGH (ref 1.7–7.7)

## 2010-10-02 LAB — COMPREHENSIVE METABOLIC PANEL
ALT: <5 U/L (ref 0–35)
AST: 8 U/L (ref 0–37)
Albumin: 2.8 g/dL — ABNORMAL LOW (ref 3.5–5.2)
Alkaline Phosphatase: 81 U/L (ref 39–117)
CO2: 24 mEq/L (ref 19–32)
Chloride: 104 mEq/L (ref 96–112)
Creatinine, Ser: 1.46 mg/dL — ABNORMAL HIGH (ref 0.50–1.10)
GFR calc non Af Amer: 34 mL/min — ABNORMAL LOW (ref >60)
Potassium: 4.6 mEq/L (ref 3.5–5.1)
Total Bilirubin: 0.4 mg/dL (ref 0.3–1.2)

## 2010-10-02 LAB — CBC
HCT: 35.3 % — ABNORMAL LOW (ref 36.0–46.0)
MCH: 27.7 pg (ref 26.0–34.0)
MCHC: 30.9 g/dL (ref 30.0–36.0)
MCV: 89.6 fL (ref 78.0–100.0)
Platelets: 356 10*3/uL (ref 150–400)
RDW: 14 % (ref 11.5–15.5)
WBC: 14.8 10*3/uL — ABNORMAL HIGH (ref 4.0–10.5)

## 2010-10-02 LAB — CK TOTAL AND CKMB (NOT AT ARMC): Relative Index: INVALID (ref 0.0–2.5)

## 2010-10-02 LAB — TROPONIN I: Troponin I: <0.3 ng/mL (ref <0.30)

## 2010-10-02 LAB — URINE MICROSCOPIC-ADD ON: Urine-Other: NONE SEEN

## 2010-10-03 ENCOUNTER — Encounter: Payer: Medicare Other | Admitting: Vascular Surgery

## 2010-10-03 NOTE — Progress Notes (Signed)
This encounter was created in error - please disregard.

## 2010-10-04 ENCOUNTER — Inpatient Hospital Stay (HOSPITAL_COMMUNITY): Payer: Medicare Other

## 2010-10-04 LAB — CBC
HCT: 31.1 % — ABNORMAL LOW (ref 36.0–46.0)
RDW: 14.3 % (ref 11.5–15.5)
WBC: 12.1 10*3/uL — ABNORMAL HIGH (ref 4.0–10.5)

## 2010-10-04 LAB — COMPREHENSIVE METABOLIC PANEL
ALT: <5 U/L (ref 0–35)
AST: 11 U/L (ref 0–37)
Albumin: 2.4 g/dL — ABNORMAL LOW (ref 3.5–5.2)
Alkaline Phosphatase: 87 U/L (ref 39–117)
BUN: 23 mg/dL (ref 6–23)
Chloride: 107 mEq/L (ref 96–112)
Potassium: 4.7 mEq/L (ref 3.5–5.1)
Total Bilirubin: 0.4 mg/dL (ref 0.3–1.2)

## 2010-10-04 LAB — MAGNESIUM: Magnesium: 2 mg/dL (ref 1.5–2.5)

## 2010-10-05 ENCOUNTER — Inpatient Hospital Stay (HOSPITAL_COMMUNITY): Payer: Medicare Other

## 2010-10-06 LAB — BASIC METABOLIC PANEL
BUN: 18 mg/dL (ref 6–23)
CO2: 23 mEq/L (ref 19–32)
Chloride: 108 mEq/L (ref 96–112)
Creatinine, Ser: 1.51 mg/dL — ABNORMAL HIGH (ref 0.50–1.10)
Glucose, Bld: 100 mg/dL — ABNORMAL HIGH (ref 70–99)

## 2010-10-07 LAB — BASIC METABOLIC PANEL
CO2: 24 mEq/L (ref 19–32)
Calcium: 9.9 mg/dL (ref 8.4–10.5)
Creatinine, Ser: 1.5 mg/dL — ABNORMAL HIGH (ref 0.50–1.10)
Glucose, Bld: 95 mg/dL (ref 70–99)

## 2010-10-08 NOTE — Consult Note (Signed)
  [name] [name], SCHLOTZHAUER            ACCOUNT NO.:  [id number]  MEDICAL RECORD NO.:  [id number]  LOCATION:  2005                         FACILITY:  [facility]  PHYSICIAN:  Aubrianne Molyneux C. [name] [name], M.D.    DATE OF BIRTH:  1923/09/10  DATE OF CONSULTATION: DATE OF DISCHARGE:                                CONSULTATION   REFERRING PHYSICIAN:  Fransisco Hertz, MD  REASON FOR CONSULTATION:  Suspected gastroparesis.  HISTORY OF PRESENT ILLNESS:  The patient is an 77 year old white female who carries a diagnosis of chronic mesenteric ischemia with her SMA and celiac vein fed by her IMA.  On September 08, 2010, she underwent left renal artery stenting for bilateral renal artery stenosis.  On Jul 31, 2010, she had left common iliac artery stenting for severe left leg peripheral arterial disease.  The reason for the consultation is that she had an abdominal CT scan after her last stent procedure, which was interpreted showing dilated stomach by the referring physician.  I have reviewed the films with Radiology and they did not note any definite gastric thickening or significant distention, but there was significant amount of debris in the stomach.  Of note, on previous CT scan in April, there was some duodenal thickening that is not seen on the current procedure. When questioning the patient, she does admit to a 30-40 pound weight loss since about November 2012.  She is chronically a finicky eater, but would say her appetite has decreased in the last few months.  She denies nausea except very infrequently.  She does admit to early satiety fairly often.  We are therefore consulted to consider for better diagnosis and treatment of possible gastroparesis or other reasons for delayed gastric emptying.  PAST MEDICAL HISTORY: 1. Severe atherosclerosis. 2. Peripheral vascular disease. 3. Mesenteric vascular disease. 4. History of paroxysmal supraventricular tachycardia. 5. History of tachy-brady syndrome. 6.  Hypertension. 7. Status post permanent pacemaker placement.  PAST SURGICAL HISTORY: 1. Appendectomy. 2. Hysterectomy. 3. Permanent pacemaker implantation.  SOCIAL HISTORY:  The patient is widowed.  She lives with her daughter. She denies alcohol or tobacco use.  FAMILY HISTORY:  Negative for GI malignancy.  PHYSICAL EXAMINATION:  GENERAL:  Elderly frail-appearing white female. Alert and oriented in no acute distress. HEART:  Regular rate and rhythm without murmur.  Currently, there are murmurs. ABDOMEN:  Soft, slightly distended with normoactive bowel sounds. Fairly soft.  No hepatosplenomegaly, mass, or guarding.  IMPRESSION: 1. Weight loss. 2. Early satiety. 3. Significant contents in the stomach on abdominal CT scan.  PLAN:  We will begin workup with nuclear medicine gastric emptying study.  We will consider EGD based on results and response to prokinetic agent if tried, probably would not be unreasonable to treat her empirically with PPI, especially if we opt not to endoscope her.  I would have a low threshold; however, given the weight loss.          ______________________________ Everardo All [name] [name], M.D.     JCH/MEDQ  D:  09/09/2010  T:  09/09/2010  Job:  [id number]  cc:   Fransisco Hertz, MD  Electronically Signed by Dorena Cookey M.D. on 10/08/2010 07:01:56 PM

## 2010-10-08 NOTE — Op Note (Signed)
  NAMEJANNETH, [name] [name]            ACCOUNT NO.:  [id number]  MEDICAL RECORD NO.:  [id number]  LOCATION:  2005                         FACILITY:  [facility]  PHYSICIAN:  Lorrie Strauch C. [name], M.D.    DATE OF BIRTH:  1923/09/25  DATE OF PROCEDURE:  09/11/2010 DATE OF DISCHARGE:                              OPERATIVE REPORT   INDICATIONS FOR PROCEDURE:  Nausea, early satiety weight loss, and suspected delayed gastric emptying on nuclear medicine gastric emptying study.  PROCEDURE:  The patient was placed in the left lateral decubitus position and placed on the pulse monitor with continuous low-flow oxygen delivered by nasal cannula.  She was sedated with 25 mcg IV fentanyl and 2.5 mg IV Versed.  Olympus video endoscope was advanced under direct vision into the oropharynx and esophagus.  The esophagus was straight and of normal caliber at the squamocolumnar line at 38 cm.  There was no visible hiatal hernia, ring stricture, or other abnormality of the gastroesophageal junction.  The stomach was entered and a small amount of liquid secretions were suctioned from the fundus.  Retroflexed view of cardia was unremarkable.  The fundus appeared normal.  Within the body and distal antrum, there was evidence of patchy gastritis with one shallow ulcer with exudate in the midbody of the stomach without any real depth and no abnormal-appearing mucosa surrounding it.  Biopsies were taken of the antrum to rule out H. pylori.  The duodenum was entered and both bulb and second portion were well inspected and appeared to be within normal limits.  There is no retained food seen anywhere in the stomach.  Scope was then withdrawn and the patient returned to the recovery room in stable condition.  She tolerated the procedure well and there were no immediate complications.  IMPRESSION: 1. Gastritis with moderate-size gastric erosion or shallow ulcer. 2. No evidence of gastric outlet obstruction or retained  gastric     contents.  PLAN:  We will begin low-dose Reglan 5 mg 3 times a day and continue omeprazole 40 mg b.i.d. till seen in the office.          ______________________________ Everardo All. [name], M.D.     JCH/MEDQ  D:  09/11/2010  T:  09/11/2010  Job:  [id number]  cc:   Fransisco Hertz, MD  Electronically Signed by Dorena Cookey M.D. on 10/08/2010 07:01:59 PM

## 2010-10-09 NOTE — Discharge Summary (Signed)
  [name] [name], [name] [name]            ACCOUNT NO.:  [id number]  MEDICAL RECORD NO.:  [id number]  LOCATION:                                 FACILITY:  PHYSICIAN:  Pearla Dubonnet, M.D.DATE OF BIRTH:  [birth year]  DATE OF ADMISSION: DATE OF DISCHARGE:                              DISCHARGE SUMMARY   ADDENDUM: There has been no changes in her condition over the past 24 hours.  She is still slightly confused in the morning and this could be from variety of reasons.  She has been started on Zyprexa to help with mood and stimulation of appetite.  This is being given at night.  She is also on Vicodin and she is also on Reglan which can cause some confusion and sedation.  She is alert and conversive this morning on evaluation, but slightly more sedated than usual.  My recommendations would be to transition to tramadol from Vicodin 50 mg p.o. q.8 h. p.r.n. and also consider discontinuing her Reglan.  Reglan can certainly have CNS side effects in patients at her age and I think that the appetite stimulation and treatment of depression is paramount.  Another consideration would be to switch from Zyprexa to Symbyax which also has fluoxetine in combination with the Zyprexa to also help stimulate mood and appetite.  She is followed by Dr. Imogene Burn from [facility] and should see him in followup in the next weak or two for her known peripheral vascular disease.  PHYSICAL EXAMINATION:  VITAL SIGNS:  At the time of discharge reveal a temperature of 98.1, pulse 79 and regular, respiratory rate 20 nonlabored, blood pressure 142/79, and O2 saturation on room air is 91%.  Again, no changes to her current medications, but the above mentioned medication changes could be considered while having physical therapy at the skilled nursing facility.  CONDITION ON DISCHARGE:  Improved.     Pearla Dubonnet, M.D.     RNG/MEDQ  D:  10/08/2010  T:  10/08/2010  Job:  [id number]  Electronically Signed  by Marden Noble M.D. on 10/09/2010 04:54:09 AM

## 2010-10-09 NOTE — Discharge Summary (Signed)
[name] [name], [name] [name]            ACCOUNT NO.:  [id number]  MEDICAL RECORD NO.:  [id number]  LOCATION:  1344                         FACILITY:  [facility]  PHYSICIAN:  Pearla Dubonnet, M.D.DATE OF BIRTH:  11-30-1923  DATE OF ADMISSION:  10/02/2010 DATE OF DISCHARGE:  10/07/2010                              DISCHARGE SUMMARY   DISCHARGE DIAGNOSES: 1. Mild confusion felt possibly secondary to Reglan therapy which was     decreased. 2. Generalized weakness and gait instability, multifactorial. 3. Anorexia, likely multifactorial. 4. Chronic back pain secondary to degenerative joint disease. 5. Chronic obstructive pulmonary disease. 6. Tachybrady syndrome status post permanent pacemaker placement. 7. History of blood loss anemia secondary to gastric ulcer. 8. History of tobacco use. 9. Peripheral vascular disease with left common iliac artery stent. 10.Bilateral renal artery stenosis with right kidney atrophy and left     renal artery stenting. 11.Chronic mesenteric ischemia.  DISCHARGE MEDICATIONS.: 1. Dulcolax suppository p.r.n. 10 mg 2. Vicodin 1 tablet q.4 h p.r.n. back pain. 3. Reglan 5 mg 1/2 hour prior to meals. 4. Zyprexa 2.5 mg daily at bedtime. 5. MiraLax 17 g in 8 ounces of water daily, hold for diarrhea. 6. Senokot-S 1 tablet p.o. b.i.d., hold for diarrhea. 7. Losartan 100 mg 1/2 tablet or 50 mg daily. 8. Atenolol 50 mg daily. 9. Plavix 75 mg daily. 10.Protonix 40 mg daily.  ALLERGIES:  No known allergies.  DISCHARGE LABORATORY DATA:  Discharge laboratories are as follows:  From October 07, 2010, basic metabolic profile reveals sodium 137, potassium 3.9, chloride 106, bicarb 24, glucose 95, BUN 19, creatinine 1.5, estimated GFR was 33 mm/min representing stage 3 kidney disease.  Laboratories from October 04, 2010, revealed a normal phosphorous at 3.1, normal magnesium at 2.0, white count was 12,100, hemoglobin 9.9 and platelet count 326000.  LFTs were within normal  limits except for low albumin at 2.4.  TSH was normal at 0.819 on October 03, 2010, and CK-MB was normal at 0.9 on October 02, 2010.  Troponin was less than 0.30 and urinalysis revealed 30 mg/dL protein but was otherwise negative.  HOSPITAL COURSE:  [name] [name] is a pleasant 77 year old female with peripheral vascular disease and bilateral renal artery stenosis and has right kidney atrophy and left renal artery stenting.  She also has a history of gastric ulcer with chronic anorexia and failure to thrive.. She has chronic mesenteric ischemia and is status post permanent pacemaker placement secondary to sick sinus syndrome.  She is brought to the [facility] Emergency Room by her daughter with complaint of generalized weakness, decreased appetite and having low back pain.  She had recently had Reglan increased from t.i.d. to q.i.d.. Her creatinine was slightly elevated at 1.46 and white count in the ER was 14,800, hemoglobin 10.9.  Her daughter thinks that she is depressed. She was admitted for failure to thrive.While hospitalized she was rehydrated with IV fluids with improvement in BUN and creatinine.  Back pain was treated with Percocet.  Lumbar spine films performed on Aug 04, 2010, revealed diffuse osteopenia but no acute compression deformities.  Back pain had improved by the time of discharge.  For what was felt to be depressed mood and decreased  appetite, she was started on Zyprexa 2.5 mg at night.  She did have some mild confusion on the day of morning of planned discharge but she was conversive and was able to remember events of the night according to the nurse.  She will be discharged home on a decreased dose of losartan thinking that it was probably causing increased azotemia and she will be given physical therapy at skilled nursing facility for unsteady gait felt to be multifactorial.  There were no focal neurologic symptoms or signs during the hospitalization.  She was  found by Physical Therapy to be unsteady with transfers and physical therapy will be instituted at skilled nursing facility with hopes of having to return home in the next several weeks.  It is felt that she likely has asthenia which is multifactorial and it is hoped that the Zyprexa will help improve appetite and mood.  CONDITION ON DISCHARGE:  Improving.  Time spent evaluating the patient, discussing discharge with family who was present at the time of discharge and perform discharge summary was 35 minutes.  She is a no code blue.     Pearla Dubonnet, M.D.     RNG/MEDQ  D:  10/07/2010  T:  10/07/2010  Job:  [id number]  Electronically Signed by Marden Noble M.D. on 10/09/2010 06:28:50 AM

## 2010-10-18 NOTE — H&P (Signed)
[name] [name], [name] [name]            ACCOUNT NO.:  [id number]  MEDICAL RECORD NO.:  [id number]  LOCATION:  WLED                         FACILITY:  [facility]  PHYSICIAN:  Houston Siren, MD           DATE OF BIRTH:  23-Sep-1923  DATE OF ADMISSION:  10/02/2010 DATE OF DISCHARGE:                             HISTORY & PHYSICAL   PRIMARY CARE PHYSICIAN:  None.  ADVANCE DIRECTIVE:  Do not resuscitate.  This was re-confirmed tonight.  REASON FOR ADMISSION:  Weakness, increased creatinine, and slight volume depletion.  HISTORY OF PRESENT ILLNESS:  This is an 77 year old female with  peripheral vascular disease, bilateral renal stenosis status post right kidney atrophy and left renal artery stenting, history of gastric ulcer, failure to thrive, anorexia, chronic mesenteric ischemia, status post permanent pacemaker placement because of sick sinus syndrome, brought in by her daughter with a complaint of generalized weakness, decreased appetite, and having back pain.  She has not taken any narcotic, but it was noted that her Reglan was increased from t.i.d. to q.i.d.  She also has been on atenolol at 50 mg per day for many years.  Evaluation in emergency room showed that her creatinine is slightly increased to 1.46. She also has a white count of 40,800 with a hemoglobin of 10.9.  It should be noted that though she denied being depressed,  she admitted that this past several months has been quite stressful for her as she did not have any significant medical problem until recently.  She denied any fever, chills, black stool, or bloody stool.  She does not have any abdominal pain, but did feel increased back pain.  Hospitalist was asked to admit her for further evaluation and treatment.  PAST MEDICAL HISTORY: 1. Acute renal failure. 2. Acute respiratory failure. 3. Healthcare-acquired pneumonia. 4. Tachybrady syndrome, status post permanent pacemaker placement. 5. Acute blood loss anemia. 6. History  of tobacco use. 7. Peripheral vascular disease. 8. Status post left common iliac artery stent. 9. Chronic mesentery ischemia.  MEDICATIONS: 1. Reglan 1 tablet p.o. q.i.d. 2. Protonix 40 mg b.i.d. 3. Atenolol 50 mg per day. 4. Aspirin 81 mg per day. 5. Plavix 75 mg with meal. 6. Losartan 100 mg. 7. Megace. 8. Nicotine patch.  FAMILY HISTORY:  Noncontributory.  SOCIAL HISTORY:  She lives with her daughter.  She is a widow, moved to [place] from [facility].  She has a very supportive family.  REVIEW OF SYSTEMS:  Otherwise unremarkable.  PHYSICAL EXAMINATION:  VITAL SIGNS:  Blood pressure 170/80, heart rate of 80, respiratory rate of 12, temperature 98.6. GENERAL:  She is alert and oriented and conversing. HEENT:  She has facial symmetry and fluent speech.  Tongue is midline. Uvula elevated with phonation. NECK:  Supple.  She has no masses on her neck.  No carotid bruit that I can hear. CARDIAC:  S1 and S2, regular.  There is a soft 2/6 systolic ejection murmur at the left sternal border. LUNGS:  Clear with good breath sound bilaterally. ABDOMINAL:  Soft, nondistended, nontender.  No rebound.  No palpable mass. EXTREMITIES:  No edema.  No calf tenderness. She has adequate pulses bilaterally. SKIN:  Warm  and dry and slightly poor skin turgor. NEUROLOGIC AND PSYCHIATRIC:  Unremarkable as well.  OBJECTIVE FINDINGS:  Pertinent labs include white count of 14,800, hemoglobin of 10.9, MCV of 90.  Urinalysis is negative.  Troponin less than 0.3, potassium 4.6, creatinine 1.46, glucose of 119.  Chest x-ray show interval clear of left-sided pleural effusion.  CT scan of her head shows no intracranial bleed, no acute infarct.  IMPRESSION:  This is an 77 year old with multiple medical problems including peripheral vascular disease, right kidney atrophy, renal artery stenosis, status post stenting, history of failure to thrive, gastric ulcer, arrhythmia status post permanent  pacemaker placement, presented to the emergency room with complaint of generalized weakness and decrease in appetite.  I suspect that this is multifactorial including side effect of several medications as she had been on atenolol and Reglan. This could also be from depression.  Plan will be to admit her to adjust her medications.  I will decrease her atenolol to 25 mg per day and decrease her Reglan to 3 times a day instead of 4.  We will check her thyroid function test and cortisol level.  I noted that her creatinine is elevated slightly, and we shall also decrease her losartan to 75 mg per day.  If her blood pressure is elevated, we will add amlodipine to her regimen.  She is slightly dehydrated as well and will receive gentle IV fluids.  As soon as her medication list is reconciled, we will continue them as all except for those noted above.  She is a DNR and I have re-confirmed this tonight. We will admit her to [facility].     Houston Siren, MD     PL/MEDQ  D:  10/03/2010  T:  10/03/2010  Job:  [id number]  Electronically Signed by Houston Siren  on 10/18/2010 04:07:02 AM

## 2010-12-11 ENCOUNTER — Encounter: Payer: Self-pay | Admitting: Vascular Surgery

## 2010-12-12 ENCOUNTER — Ambulatory Visit (INDEPENDENT_AMBULATORY_CARE_PROVIDER_SITE_OTHER): Payer: Medicare Other | Admitting: Vascular Surgery

## 2010-12-12 ENCOUNTER — Encounter: Payer: Self-pay | Admitting: Vascular Surgery

## 2010-12-12 VITALS — BP 198/93 | HR 85 | Resp 16 | Ht 62.0 in | Wt 115.0 lb

## 2010-12-12 DIAGNOSIS — I70219 Atherosclerosis of native arteries of extremities with intermittent claudication, unspecified extremity: Secondary | ICD-10-CM

## 2010-12-12 NOTE — Progress Notes (Signed)
[facility] [place]  Established Mesenteric Ischemia  History of Present Illness  [name] [name] is a 77 y.o. female who presents with chief complaint: routine follow-up.  This patient has atherosclerosis in multiple vascular beds, having undergone L CIA stenting, and L RA stenting.  Her SMA and Celiac arteries are occluded proximally and perfuse via large collaterals from the IMA.  Additionally, the cardiologist think her cardiac status is severe and unlikely to be amendable to intervention.   Subsequently, the family and I have been trying to limit her exposure to high risk interventions.  The patient notes currently no abdominal pain.  Her appetite has improved and she has been gaining weight: 102 to 115 lbs.  He bowel movements have normalized now to every 2 days.  The patient notes no food fear.  The patient notes no significant changes in her BP or urinary habits.  Additionally, she does notes some continued numbness in her left foot which was present even prior to the L CIA stenting.  Patient PMH, PSH, SH, FamHx, Medications, Allergies, and ROS are unchanged from previous visit on 08/29/10.  ROS as noted as above, otherwise negative.  Physical Examination  Filed Vitals:   12/12/10 1001  BP: 198/93  Pulse: 85  Resp: 16  Height: 5\' 2"  (1.575 m)  Weight: 115 lb (52.164 kg)    General: A&O x 3, WDWN, elderly and cachectic  Pulmonary: Sym exp, good air movt, CTAB, no rales, rhonchi, & wheezing  Cardiac: RRR, Nl S1, S2, no Murmurs, rubs or gallops  Vascular: Vessel Right Left  Radial Palpable Palpable  Brachial Palpable Palpable  Carotid Palpable, without bruit Palpable, without bruit  Aorta Non-palpable N/A  Femoral Weakly Palpable Strongly Palpable  Popliteal Non-palpable Non-palpable  PT Non-Palpable Non-Palpable  DP Non-Palpable Non-Palpable   Gastrointestinal: soft, NTND, -G/R, - HSM, - masses, - CVAT B  Musculoskeletal: M/S 5/5 throughout ,  Extremities without ischemic changes   Neurologic: Pain and light touch intact in extremities , Motor exam as listed above  Medical Decision Making  [name] [name] is a 77 y.o. female who presents with: stable asx chronic mesenteric ischemia, BLE PAD, and B RAS.   Based on her exam and studies, I have offered the patient surveillance studies including: BLE ABI, Aortoiliac duplex, Mesenteric duplex, and B renal artery duplex in the next month.  As we discussed previously, we will avoid any significant operations in this patient due to her cardiac risks > benefit.  She will continue with her Plavix use and we discussed previously.  I discussed in depth with the patient the nature of atherosclerosis, and emphasized the importance of maximal medical management including strict control of blood pressure, blood glucose, and lipid levels, obtaining regular exercise, and cessation of smoking.  The patient is aware that without maximal medical management the underlying atherosclerotic disease process will progress, limiting the benefit of any interventions.  Thank you for allowing [place] to participate in this patient's care.  Leonides Sake, MD [facility] [place] Office: [phone] Pager: [phone]

## 2010-12-23 ENCOUNTER — Other Ambulatory Visit: Payer: Self-pay | Admitting: Internal Medicine

## 2010-12-23 DIAGNOSIS — R7989 Other specified abnormal findings of blood chemistry: Secondary | ICD-10-CM

## 2010-12-24 ENCOUNTER — Telehealth: Payer: Self-pay

## 2010-12-24 ENCOUNTER — Ambulatory Visit
Admission: RE | Admit: 2010-12-24 | Discharge: 2010-12-24 | Disposition: A | Discharge status: Home / Self Care | Payer: Medicare Other | Source: Ambulatory Visit | Attending: Internal Medicine | Admitting: Internal Medicine

## 2010-12-24 DIAGNOSIS — R7989 Other specified abnormal findings of blood chemistry: Secondary | ICD-10-CM

## 2010-12-24 NOTE — Telephone Encounter (Signed)
Patient's daughter called on 12/23/10 regarding scheduled vascular lab appointments and appointment with Dr. Imogene Burn on 01/02/11.  She stated that patient's creatinine and BUN have become elevated and patient is scheduled for renal ultrasound on 12/24/10.  She was inquiring about the vascular lab appointments on 01/02/11 and the possibility of moving the apppointments to an earlier time.  The appointments for the renal artery duplex and the ABI's have been rescheduled for 12/26/10. The aortoiliac will be performed on 01/02/11.  The patient's daughter, Adolph Pollack, was called with this appointment information.

## 2010-12-26 ENCOUNTER — Other Ambulatory Visit (INDEPENDENT_AMBULATORY_CARE_PROVIDER_SITE_OTHER): Payer: Medicare Other

## 2010-12-26 ENCOUNTER — Encounter: Payer: Self-pay | Admitting: Vascular Surgery

## 2010-12-26 ENCOUNTER — Ambulatory Visit (INDEPENDENT_AMBULATORY_CARE_PROVIDER_SITE_OTHER): Payer: Medicare Other | Admitting: Vascular Surgery

## 2010-12-26 DIAGNOSIS — Z48812 Encounter for surgical aftercare following surgery on the circulatory system: Secondary | ICD-10-CM

## 2010-12-26 DIAGNOSIS — I701 Atherosclerosis of renal artery: Secondary | ICD-10-CM

## 2010-12-26 DIAGNOSIS — I70219 Atherosclerosis of native arteries of extremities with intermittent claudication, unspecified extremity: Secondary | ICD-10-CM

## 2010-12-26 DIAGNOSIS — I7092 Chronic total occlusion of artery of the extremities: Secondary | ICD-10-CM

## 2010-12-26 DIAGNOSIS — K559 Vascular disorder of intestine, unspecified: Secondary | ICD-10-CM

## 2010-12-26 NOTE — Progress Notes (Signed)
[facility] [place]  Established Mesenteric Ischemia  History of Present Illness  [name] [name] is a 77 y.o. female who presents with chief complaint: left chest pain.  The patient notes no abdominal pain.  She had been gaining weight when she started to develop left posterior shoulder pain which is self limited and resolves spontaneously.  The patient denies any heartburn sx, hematochezia, hematemesis, or melena.  She also notes recent elevation in creatinine.    Patient PMH, PSH, SH, FamHx, Medications, Allergies, and ROS are unchanged from previous visit on 11/22/10.  Physical Examination  Filed Vitals:   12/26/10 1550  BP: 168/73  Pulse: 90  Resp: 20  Height: 5\' 3"  (1.6 m)  Weight: 113 lb 3.2 oz (51.347 kg)    General: A&O x 3, WDWN, cachectic  Pulmonary: Sym exp, good air movt, CTAB, no rales, rhonchi, & wheezing  Cardiac: RRR, Nl S1, S2, no Murmurs, rubs or gallops  Vascular: Vessel Right Left  Radial Palpable Palpable  Brachial Palpable Palpable  Carotid Palpable, without bruit Palpable, without bruit  Aorta Non-palpable N/A  Femoral Palpable Palpable  Popliteal Non-palpable Non-palpable  PT Palpable Non-Palpable  DP Palpable Non-Palpable   Gastrointestinal: soft, NTND, -G/R, - HSM, - masses, - CVAT B  Musculoskeletal: M/S 5/5 throughout , Extremities without ischemic changes   Neurologic: Pain and light touch intact in extremities , Motor exam as listed above  Non-Invasive Vascular Imaging  RENAL Duplex (Date: 12/26/10):   R RA > 60% stenosis  Widely patent L RA stent  R kidney: 7.75 cm  L kidney: 10.8 cm  MESENTERIC Duplex (Date: 12/26/10):   Ao: 134 c/s  Celiac artery: occluded  SMA: occluded  IMA: 469 c/s  Aortoiliac Duplex (Date: 12/26/10):   R CIA: 77-129 c/s, R EIA: 76-150 c/s  L CIA 137-40 c/s (patent stent), L EIA: 108-166 c/s  BLE ABI (Date: 12/26/10)  R: 0.95, DP & PT: biphasic  L: 0.58, DP and PT:  monophasic  Medical Decision Making  [name] [name] is a 77 y.o. female who presents with: known chronic mesenteric ischemia, BLE PAD s/p L CIA stenting, and B RAS s/p L RAS.   The patient's current sx should be worked up further for possible angina vs GERD vs CMI  I told her to continue her Protonix at BID dosing to r/u GERD as the etiology  I gave the patient and family instructing to go to ER if she has another severe sx episode  CMI: no surgical intervention planned, will manage expectantly as discussed multiple times with the family  B RAS: R kidney was already < 8 cm prev, hence no salvage attempt, L RA stent is widely patent with good flow rates to the kidney  BLE PAD: R leg is in good shape, L leg has known SFA occlusion, she not a surgical candidate of for bypass, L CIA stent widely open -> improved flow to the left foot  I discussed in depth with the patient the nature of atherosclerosis, and emphasized the importance of maximal medical management including strict control of blood pressure, blood glucose, and lipid levels, obtaining regular exercise, and cessation of smoking.  The patient is aware that without maximal medical management the underlying atherosclerotic disease process will progress, limiting the benefit of any interventions.  We will continue with surveillance studies (BLE ABI, Mesenteric duplex, Renal duplex) in 3 months and she will follow up at that time  Thank you for allowing [place] to participate  in this patient's care.  [name] Sake, MD [facility] [place] Office: [phone] Pager: [phone]

## 2011-01-01 NOTE — Procedures (Unsigned)
AORTA-ILIAC DUPLEX EVALUATION  INDICATION:  Previous left common iliac artery stent placement on 07/30/10.  HISTORY: Diabetes:  No. Cardiac:  Congestive heart failure. Hypertension:  Yes. Smoking:  Previous. Previous Surgery:              SINGLE LEVEL ARTERIAL EXAM                             RIGHT                  LEFT Brachial: Anterior tibial: Posterior tibial: Peroneal: Ankle/brachial index:      0.91                   0.58 Previous ABI/date:         08/29/10, 0.98         08/29/10, 0.66  AORTA-ILIAC DUPLEX EXAM Aorta - Proximal     76 cm/s Aorta - Mid          93 cm/s Aorta - Distal       134 cm/s  RIGHT                                   LEFT 77 cm/s           CIA-PROXIMAL          Patent stent, 137 cm/s 129 cm/s          CIA-DISTAL            140 cm/s Not visualized    HYPOGASTRIC           Not visualized 132 cm/s          EIA-PROXIMAL          118 cm/s 150 cm/s          EIA-MID               166 cm/s 76 cm/s           EIA-DISTAL            108 cm/s  IMPRESSION:  Patent left common iliac artery stent with velocities within normal limits, although technically difficult to visualize secondary to calcified shadowing plaque.  ___________________________________________ Fransisco Hertz, MD  CI/MEDQ  D:  12/26/2010  T:  12/26/2010  Job:  [id number]

## 2011-01-01 NOTE — Procedures (Unsigned)
RENAL ARTERY DUPLEX EVALUATION  INDICATION:  Status post left renal artery stent placement on 09/08/10. Known 90% right renal artery stenosis with small right kidney.  There is known occlusion of the celiac and superior mesenteric arteries.  HISTORY: Diabetes:  No. Cardiac:  Congestive heart failure. Hypertension:  Yes. Smoking:  Previously.  RENAL ARTERY DUPLEX FINDINGS: Aorta-Proximal:  76 cm/s Aorta-Mid:  93 cm/s Aorta-Distal:  134 cm/s Celiac Artery Origin:  Known occlusion SMA Origin:  Known occlusion IMA Origin:  469 cm/s                                   RIGHT               LEFT Renal Artery Origin:             365 cm/s            Patent stent, 85 cm/s Renal Artery Proximal:           494 cm/s            Stent, 63 cm/s Renal Artery Mid:                57 cm/s             69 cm/s Renal Artery Distal:             47 cm/s             46 cm/s Hilar Acceleration Time (AT): Renal-Aortic Ratio (RAR):        5.3                 0.67 Kidney Size:                     7.75 cm             10.8 cm End Diastolic Ratio (EDR): Resistive Index (RI):                                0.86  IMPRESSION: 1. >60% stenosis of the right renal artery. 2. Patent left renal artery stent with velocities within normal     limits. 3. Significant stenosis of the inferior mesenteric artery. 4. 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  ___________________________________________ Fransisco Hertz, MD  CI/MEDQ  D:  12/26/2010  T:  12/26/2010  Job:  [id number]

## 2011-01-02 ENCOUNTER — Other Ambulatory Visit: Payer: Medicare Other

## 2011-04-02 ENCOUNTER — Encounter: Payer: Self-pay | Admitting: Vascular Surgery

## 2011-04-03 ENCOUNTER — Other Ambulatory Visit: Payer: Medicare Other

## 2011-04-03 ENCOUNTER — Other Ambulatory Visit (INDEPENDENT_AMBULATORY_CARE_PROVIDER_SITE_OTHER): Payer: Medicare Other | Admitting: *Deleted

## 2011-04-03 ENCOUNTER — Ambulatory Visit (INDEPENDENT_AMBULATORY_CARE_PROVIDER_SITE_OTHER): Payer: Medicare Other | Admitting: Vascular Surgery

## 2011-04-03 ENCOUNTER — Ambulatory Visit: Payer: Medicare Other | Admitting: *Deleted

## 2011-04-03 ENCOUNTER — Encounter: Payer: Self-pay | Admitting: Vascular Surgery

## 2011-04-03 VITALS — BP 197/94 | HR 100 | Resp 14 | Ht 63.0 in | Wt 113.9 lb

## 2011-04-03 DIAGNOSIS — K551 Chronic vascular disorders of intestine: Secondary | ICD-10-CM

## 2011-04-03 DIAGNOSIS — I701 Atherosclerosis of renal artery: Secondary | ICD-10-CM

## 2011-04-03 DIAGNOSIS — I70219 Atherosclerosis of native arteries of extremities with intermittent claudication, unspecified extremity: Secondary | ICD-10-CM

## 2011-04-03 NOTE — Progress Notes (Signed)
[facility] [place]  Established Chronic Mesenteric Ischemia,  Intermittent Claudication, and Renal Artery Stenosis  History of Present Illness  [name] [name] is a 78 y.o. female who presents with chief complaint: routine follow-up. Patient is s/p L CIA stenting, and L RA stenting.  She denies any significant abdominal pain.  Though she does not some back pain that improves with propping her back with a pillow.  Her weight has been fluctuating as has her appetite.  She is not eating all of her food due to early satiety, not food fear or pain with eating.  She denies any hematochezia or melena.    Additionally, She notes she persistent numbness in her left foot.  She is able to ambulate enough to complete her ADL.  She denies any foot wounds, ulcer, or drainage.  Additionally, she notes her BP has been elevated because she has not been regularly taking her anti-HTN rx.  She denies any changes in her urinary habits.  By report, her last Cr was 1.9.  She is not seeing a nephrologist currently.    Overall, the daughter notes deterioration in energy level and abilities to perform ADLs.  The pt is now moving in with her daughter and no longer is driving.  Patient PMH, PSH, SH, FamHx, Medications, Allergies, and ROS are unchanged from previous visit on 12/08/10. ROS as noted as above, otherwise negative.   Physical Examination  Filed Vitals:   04/03/11 1127  BP: 197/94  Pulse: 100  Resp: 14  Height: 5\' 3"  (1.6 m)  Weight: 113 lb 14.4 oz (51.665 kg)  SpO2: 94%   Body mass index is 20.18 kg/(m^2).  General: A&O x 3, WDWN, elderly and cachectic   Pulmonary: Sym exp, good air movt, CTAB, no rales, rhonchi, & wheezing   Cardiac: RRR, Nl S1, S2, no Murmurs, rubs or gallops   Vascular:  Vessel  Right  Left   Radial  Palpable  Palpable   Brachial  Palpable  Palpable   Carotid  Palpable, without bruit  Palpable, without bruit   Aorta  Non-palpable  N/A   Femoral  Weakly  Palpable  Strongly Palpable   Popliteal  Non-palpable  Non-palpable   PT  Non-Palpable  Non-Palpable   DP  Non-Palpable  Non-Palpable    Gastrointestinal: soft, NTND, -G/R, - HSM, - masses, - CVAT B   Musculoskeletal: M/S 5/5 throughout , Extremities without ischemic changes   Neurologic: Pain and light touch intact in extremities , Motor exam as listed above   Medical Decision Making  [name] [name] is a 78 y.o. female who presents with: stable asx chronic mesenteric ischemia, BLE PAD, and B RAS.  Based on her exam and studies, I have offered the patient surveillance studies including: BLE ABI, Aortoiliac duplex, Mesenteric duplex, and B renal artery duplex in the next 6 months.  As we discussed previously, we will avoid any significant operations in this patient due to her cardiac risks > benefit.  She will continue with her Plavix use and we discussed previously.  I discussed in depth with the patient the nature of atherosclerosis, and emphasized the importance of maximal medical management including strict control of blood pressure, blood glucose, and lipid levels, obtaining regular exercise, and cessation of smoking. The patient is aware that without maximal medical management the underlying atherosclerotic disease process will progress, limiting the benefit of any interventions.  I emphasize the importance of routinely taking her medications to avoid acute complications and also  to avoid long-term sequelae of chronic disease. Thank you for allowing [place] to participate in this patient's care.  Leonides Sake, MD  [facility] [place]  Office: [phone]  Pager: [phone]

## 2011-04-08 DIAGNOSIS — I10 Essential (primary) hypertension: Secondary | ICD-10-CM | POA: Diagnosis not present

## 2011-04-08 DIAGNOSIS — J069 Acute upper respiratory infection, unspecified: Secondary | ICD-10-CM | POA: Diagnosis not present

## 2011-04-08 DIAGNOSIS — M549 Dorsalgia, unspecified: Secondary | ICD-10-CM | POA: Diagnosis not present

## 2011-04-08 DIAGNOSIS — R05 Cough: Secondary | ICD-10-CM | POA: Diagnosis not present

## 2011-04-13 NOTE — Procedures (Unsigned)
MESENTERIC ARTERIAL DUPLEX EVALUATION  INDICATION:  Chronic mesenteric ischemia.  HISTORY: Diabetes:  No. Cardiac:  No. Hypertension:  Yes. Smoking:  Previous.  Mesenteric Duplex Findings: Aorta - Proximal                            90 Aorta - Mid                                 105 Aorta - Distal                              68  Celiac Trunk - Proximal                     Occluded Celiac Trunk - Distal                       Occluded  Hepatic Artery                              NV Splenic Artery                              NV  Superior Mesenteric Artery-Origin           Occluded Superior Mesenteric Artery-Proximal         Occluded Superior Mesenteric Artery-Mid              NV Superior Mesenteric Artery-Distal  Inferior Mesenteric Artery-Proximal         756  IMPRESSION: 1. Occluded celiac artery. 2. Occluded proximal superior mesenteric artery. 3. The origin of the inferior mesenteric artery is not well     visualized; however, severe stenosis is suspected with velocities     of >756 cm/s.  Multiple mesenteric collaterals are observed.  ___________________________________________ Fransisco Hertz, MD  LT/MEDQ  D:  04/03/2011  T:  04/04/2011  Job:  [id number]

## 2011-04-13 NOTE — Procedures (Unsigned)
AORTA-ILIAC DUPLEX EVALUATION  INDICATION:  Left CIA stent with known left SFA occlusion.  HISTORY: Diabetes:  No. Cardiac:  No. Hypertension:  Yes. Smoking:  Previous. Previous Surgery:  Left renal artery stent.              SINGLE LEVEL ARTERIAL EXAM                             RIGHT                  LEFT Brachial: Anterior tibial: Posterior tibial: Peroneal: Ankle/brachial index: Previous ABI/date:  AORTA-ILIAC DUPLEX EXAM Aorta - Proximal     90 cm/s Aorta - Mid          105 cm/s Aorta - Distal       68 cm/s  RIGHT                                   LEFT 162 cm/s          CIA-PROXIMAL          214 cm/s  (stent) 164 cm/s          CIA-DISTAL            173 cm/s  (stent) NV                HYPOGASTRIC           NV 124 cm/s          EIA-PROXIMAL          127 cm/s 128 cm/s          EIA-MID               122 cm/s 131 cm/s          EIA-DISTAL            131 cm/s  IMPRESSION: 1. Ectatic mid aorta measuring 2.45 cm.  Proximal to this is 1.12 cm,     and distal is 1.38 cm.  There is calcific  disease present with     elevated velocities and post-stenotic turbulence. 2. Widely patent iliac arteries without evidence of in-stent stenosis.  ___________________________________________ Fransisco Hertz, MD  LT/MEDQ  D:  04/03/2011  T:  04/04/2011  Job:  [id number]

## 2011-04-13 NOTE — Procedures (Unsigned)
RENAL ARTERY DUPLEX EVALUATION  INDICATION:  Left renal stent, >60% right renal artery stenosis without salvage attempt.  HISTORY: Diabetes:  No. Cardiac:  No. Hypertension:  Yes. Smoking:  Previous.  RENAL ARTERY DUPLEX FINDINGS: Aorta-Proximal:  90 cm/s Aorta-Mid:  105 cm/s Aorta-Distal:  68 cm/s Celiac Artery Origin:  Occluded SMA Origin:  Occluded                                   RIGHT               LEFT Renal Artery Origin:                                 NV Renal Artery Proximal:                               197 cm/s Renal Artery Mid:                                    108 cm/s Renal Artery Distal:                                 75 cm/s Hilar Acceleration Time (AT): Renal-Aortic Ratio (RAR):                            2.2 Kidney Size:                                         10.7 cm End Diastolic Ratio (EDR): Resistive Index (RI):                                0.74/0.77  IMPRESSION: 1. Occluded celiac and superior mesenteric artery. 2. Left renal stent could not be visualized; however, patency is     demonstrated without elevated velocities or post-stenotic     turbulence observed. 3. Right kidney was not evaluated due to previous studies. 4. Mesenteric evaluation is on separate report.  ___________________________________________ Fransisco Hertz, MD  LT/MEDQ  D:  04/03/2011  T:  04/04/2011  Job:  [id number]

## 2011-04-15 DIAGNOSIS — F05 Delirium due to known physiological condition: Secondary | ICD-10-CM | POA: Diagnosis not present

## 2011-04-15 DIAGNOSIS — Z950 Presence of cardiac pacemaker: Secondary | ICD-10-CM | POA: Diagnosis not present

## 2011-04-15 DIAGNOSIS — J449 Chronic obstructive pulmonary disease, unspecified: Secondary | ICD-10-CM | POA: Diagnosis not present

## 2011-04-15 DIAGNOSIS — R0602 Shortness of breath: Secondary | ICD-10-CM | POA: Diagnosis not present

## 2011-04-15 DIAGNOSIS — R630 Anorexia: Secondary | ICD-10-CM | POA: Diagnosis not present

## 2011-04-15 DIAGNOSIS — I4891 Unspecified atrial fibrillation: Secondary | ICD-10-CM | POA: Diagnosis not present

## 2011-04-17 ENCOUNTER — Emergency Department (HOSPITAL_COMMUNITY): Payer: Medicare Other

## 2011-04-17 ENCOUNTER — Other Ambulatory Visit: Payer: Self-pay

## 2011-04-17 ENCOUNTER — Inpatient Hospital Stay (HOSPITAL_COMMUNITY)
Admission: EM | Admit: 2011-04-17 | Discharge: 2011-04-20 | DRG: 206 | Disposition: A | Discharge status: Home / Self Care | Payer: Medicare Other | Attending: Internal Medicine | Admitting: Internal Medicine

## 2011-04-17 ENCOUNTER — Encounter (HOSPITAL_COMMUNITY): Payer: Self-pay | Admitting: *Deleted

## 2011-04-17 DIAGNOSIS — R410 Disorientation, unspecified: Secondary | ICD-10-CM

## 2011-04-17 DIAGNOSIS — I7771 Dissection of carotid artery: Secondary | ICD-10-CM | POA: Diagnosis not present

## 2011-04-17 DIAGNOSIS — R0902 Hypoxemia: Secondary | ICD-10-CM | POA: Diagnosis not present

## 2011-04-17 DIAGNOSIS — R0600 Dyspnea, unspecified: Secondary | ICD-10-CM

## 2011-04-17 DIAGNOSIS — R5383 Other fatigue: Secondary | ICD-10-CM | POA: Diagnosis not present

## 2011-04-17 DIAGNOSIS — K120 Recurrent oral aphthae: Secondary | ICD-10-CM | POA: Diagnosis present

## 2011-04-17 DIAGNOSIS — R634 Abnormal weight loss: Secondary | ICD-10-CM | POA: Diagnosis present

## 2011-04-17 DIAGNOSIS — R9431 Abnormal electrocardiogram [ECG] [EKG]: Secondary | ICD-10-CM | POA: Diagnosis not present

## 2011-04-17 DIAGNOSIS — J4489 Other specified chronic obstructive pulmonary disease: Secondary | ICD-10-CM | POA: Diagnosis not present

## 2011-04-17 DIAGNOSIS — R5381 Other malaise: Secondary | ICD-10-CM | POA: Diagnosis not present

## 2011-04-17 DIAGNOSIS — I517 Cardiomegaly: Secondary | ICD-10-CM

## 2011-04-17 DIAGNOSIS — D649 Anemia, unspecified: Secondary | ICD-10-CM

## 2011-04-17 DIAGNOSIS — F29 Unspecified psychosis not due to a substance or known physiological condition: Secondary | ICD-10-CM | POA: Diagnosis not present

## 2011-04-17 DIAGNOSIS — J449 Chronic obstructive pulmonary disease, unspecified: Secondary | ICD-10-CM

## 2011-04-17 DIAGNOSIS — D62 Acute posthemorrhagic anemia: Secondary | ICD-10-CM | POA: Diagnosis not present

## 2011-04-17 DIAGNOSIS — Z91199 Patient's noncompliance with other medical treatment and regimen due to unspecified reason: Secondary | ICD-10-CM

## 2011-04-17 DIAGNOSIS — R0602 Shortness of breath: Secondary | ICD-10-CM | POA: Diagnosis not present

## 2011-04-17 DIAGNOSIS — Z950 Presence of cardiac pacemaker: Secondary | ICD-10-CM

## 2011-04-17 DIAGNOSIS — Z9119 Patient's noncompliance with other medical treatment and regimen: Secondary | ICD-10-CM | POA: Diagnosis not present

## 2011-04-17 DIAGNOSIS — R6251 Failure to thrive (child): Secondary | ICD-10-CM

## 2011-04-17 DIAGNOSIS — R531 Weakness: Secondary | ICD-10-CM

## 2011-04-17 DIAGNOSIS — R040 Epistaxis: Secondary | ICD-10-CM | POA: Diagnosis present

## 2011-04-17 DIAGNOSIS — R509 Fever, unspecified: Secondary | ICD-10-CM | POA: Diagnosis not present

## 2011-04-17 DIAGNOSIS — R627 Adult failure to thrive: Secondary | ICD-10-CM | POA: Diagnosis present

## 2011-04-17 DIAGNOSIS — K121 Other forms of stomatitis: Secondary | ICD-10-CM | POA: Diagnosis present

## 2011-04-17 DIAGNOSIS — K1370 Unspecified lesions of oral mucosa: Secondary | ICD-10-CM | POA: Diagnosis not present

## 2011-04-17 DIAGNOSIS — J9600 Acute respiratory failure, unspecified whether with hypoxia or hypercapnia: Secondary | ICD-10-CM | POA: Diagnosis not present

## 2011-04-17 HISTORY — DX: Chronic obstructive pulmonary disease, unspecified: J44.9

## 2011-04-17 LAB — COMPREHENSIVE METABOLIC PANEL
Albumin: 3.6 g/dL (ref 3.5–5.2)
Alkaline Phosphatase: 132 U/L — ABNORMAL HIGH (ref 39–117)
BUN: 29 mg/dL — ABNORMAL HIGH (ref 6–23)
Calcium: 9.9 mg/dL (ref 8.4–10.5)
GFR calc Af Amer: 37 mL/min — ABNORMAL LOW (ref >90)
Potassium: 3.6 mEq/L (ref 3.5–5.1)
Sodium: 138 mEq/L (ref 135–145)
Total Protein: 8.3 g/dL (ref 6.0–8.3)

## 2011-04-17 LAB — URINALYSIS, ROUTINE W REFLEX MICROSCOPIC
Glucose, UA: NEGATIVE mg/dL
Leukocytes, UA: NEGATIVE
Nitrite: NEGATIVE
Specific Gravity, Urine: 1.022 (ref 1.005–1.030)
pH: 5.5 (ref 5.0–8.0)

## 2011-04-17 LAB — DIFFERENTIAL
Basophils Relative: 0 % (ref 0–1)
Eosinophils Absolute: 0.2 10*3/uL (ref 0.0–0.7)
Eosinophils Relative: 2 % (ref 0–5)
Monocytes Relative: 9 % (ref 3–12)
Neutrophils Relative %: 78 % — ABNORMAL HIGH (ref 43–77)

## 2011-04-17 LAB — CARDIAC PANEL(CRET KIN+CKTOT+MB+TROPI): Relative Index: INVALID (ref 0.0–2.5)

## 2011-04-17 LAB — CBC
MCH: 26.1 pg (ref 26.0–34.0)
MCHC: 30.6 g/dL (ref 30.0–36.0)
MCV: 85.3 fL (ref 78.0–100.0)
Platelets: 379 10*3/uL (ref 150–400)

## 2011-04-17 MED ORDER — ACETAMINOPHEN 650 MG RE SUPP: 650.0000 mg | Freq: Four times a day (QID) | RECTAL | Status: DC | PRN

## 2011-04-17 MED ORDER — TECHNETIUM TO 99M ALBUMIN AGGREGATED
5.0000 | Freq: Once | INTRAVENOUS | Status: AC | PRN
  Administered 2011-04-17: 5 via INTRAVENOUS

## 2011-04-17 MED ORDER — LOSARTAN POTASSIUM 50 MG PO TABS
50.0000 mg | ORAL_TABLET | Freq: Every day | ORAL | Status: DC
  Administered 2011-04-17 – 2011-04-20 (×4): 50 mg via ORAL
  Filled 2011-04-17 (×4): qty 1

## 2011-04-17 MED ORDER — ONDANSETRON HCL 4 MG PO TABS: 4.0000 mg | ORAL_TABLET | Freq: Four times a day (QID) | ORAL | Status: DC | PRN

## 2011-04-17 MED ORDER — ONDANSETRON HCL 4 MG/2ML IJ SOLN: 4.0000 mg | Freq: Four times a day (QID) | INTRAMUSCULAR | Status: DC | PRN

## 2011-04-17 MED ORDER — ZOLPIDEM TARTRATE 5 MG PO TABS
5.0000 mg | ORAL_TABLET | Freq: Every evening | ORAL | Status: DC | PRN
  Administered 2011-04-18 – 2011-04-19 (×2): 5 mg via ORAL
  Filled 2011-04-17 (×3): qty 1

## 2011-04-17 MED ORDER — ACETAMINOPHEN 325 MG PO TABS: 650.0000 mg | ORAL_TABLET | Freq: Four times a day (QID) | ORAL | Status: DC | PRN

## 2011-04-17 MED ORDER — XENON XE 133 GAS
5.4000 | GAS_FOR_INHALATION | Freq: Once | RESPIRATORY_TRACT | Status: AC | PRN
  Administered 2011-04-17: 5 via RESPIRATORY_TRACT

## 2011-04-17 MED ORDER — ATENOLOL 50 MG PO TABS
50.0000 mg | ORAL_TABLET | Freq: Every day | ORAL | Status: DC
  Administered 2011-04-18 – 2011-04-20 (×3): 50 mg via ORAL
  Filled 2011-04-17 (×3): qty 1

## 2011-04-17 MED ORDER — ZOLPIDEM TARTRATE 10 MG PO TABS: 10.0000 mg | ORAL_TABLET | Freq: Every evening | ORAL | Status: DC | PRN

## 2011-04-17 MED ORDER — CLOPIDOGREL BISULFATE 75 MG PO TABS
75.0000 mg | ORAL_TABLET | Freq: Every day | ORAL | Status: DC
  Administered 2011-04-17 – 2011-04-20 (×4): 75 mg via ORAL
  Filled 2011-04-17 (×4): qty 1

## 2011-04-17 NOTE — ED Notes (Signed)
MD Samtani at bedside 

## 2011-04-17 NOTE — ED Provider Notes (Signed)
History     CSN: [id number]  Arrival date & time 04/17/11  1210   First MD Initiated Contact with Patient 04/17/11 1231      Chief Complaint  Patient presents with  . Shortness of Breath    pt sent from PCP office due to possible PE or lung infection. Pts sats on NRB in triage 63%.    Patient brought in by daughter. Patient was apparently living in [facility] and recently moved back and has been living with her daughter. They did see the primary care physician yesterday who ordered some tests but referred them here today. The main concerns are increasing fatigue, loss of appetite, 6 pound weight loss in one week. Patient is a former smoker, but quit years ago. She's had no fevers, no chest pain, but is occasionally short of breath. No nausea, vomiting. No back pain. Patient states, "I feel fine. Really." At this point in time. She has no specific complaints. She is very spunky, awake, alert, oriented. She states "got a lot of living left to do." Family was concerned for the possibility of a "lung infection" or a "blood clot." Apparently, patient only has one kidney and does have some previous history of renal insufficiency. According to the daughter (Consider location/radiation/quality/duration/timing/severity/associated sxs/prior treatment) HPI  Past Medical History  Diagnosis Date  . Mesenteric ischemia, chronic   . Chronic total occlusion of artery of the extremities   . Tachy-brady syndrome   . Anemia   . Hypertension   . Paroxysmal supraventricular tachycardia   . SBO (small bowel obstruction)     history partial  . Diverticulosis     Past Surgical History  Procedure Date  . Appendectomy   . Abdominal hysterectomy   . Pacemaker insertion   . Iliac artery stent 07/30/10    Left CIA with angiogram    History reviewed. No pertinent family history.  History  Substance Use Topics  . Smoking status: Former Smoker -- 1.0 packs/day for 65 years    Types: Cigarettes    Quit  date: 06/22/2010  . Smokeless tobacco: Not on file  . Alcohol Use: No    OB History    Grav Para Term Preterm Abortions TAB SAB Ect Mult Living                  Review of Systems  All other systems reviewed and are negative.    Allergies  Review of patient's allergies indicates no known allergies.  Home Medications   Current Outpatient Rx  Name Route Sig Dispense Refill  . ATENOLOL 50 MG PO TABS Oral Take 50 mg by mouth daily.      [name] Kitchen CLOPIDOGREL BISULFATE 75 MG PO TABS Oral Take 75 mg by mouth daily.      [name] Kitchen DIPHENHYDRAMINE-APAP (SLEEP) 25-500 MG PO TABS Oral Take 2 tablets by mouth at bedtime as needed. For pain/sleep.    [name] Kitchen LOSARTAN POTASSIUM 100 MG PO TABS Oral Take 50 mg by mouth daily.       BP 148/52  Pulse 70  Temp(Src) 97.9 F (36.6 C) (Oral)  Resp 18  Wt 106 lb (48.081 kg)  SpO2 100%  Physical Exam  Nursing note and vitals reviewed. Constitutional: She is oriented to person, place, and time. She appears well-developed and well-nourished.  HENT:  Head: Normocephalic and atraumatic.  Eyes: Conjunctivae and EOM are normal. Pupils are equal, round, and reactive to light.  Neck: Neck supple.  Cardiovascular: Normal rate and regular rhythm.  Exam reveals no gallop and no friction rub.   No murmur heard. Pulmonary/Chest: Breath sounds normal. She has no wheezes. She has no rales. She exhibits no tenderness.  Abdominal: Soft. Bowel sounds are normal. She exhibits no distension. There is no tenderness. There is no rebound and no guarding.  Musculoskeletal: Normal range of motion.  Neurological: She is alert and oriented to person, place, and time. No cranial nerve deficit. Coordination normal.  Skin: Skin is warm and dry. No rash noted.  Psychiatric: She has a normal mood and affect.    ED Course  Procedures (including critical care time)  Labs Reviewed  CBC - Abnormal; Notable for the following:    Hemoglobin 10.1 (*)    HCT 33.0 (*)    RDW 16.4 (*)    All  other components within normal limits  DIFFERENTIAL - Abnormal; Notable for the following:    Neutrophils Relative 78 (*)    Lymphocytes Relative 11 (*)    All other components within normal limits  COMPREHENSIVE METABOLIC PANEL - Abnormal; Notable for the following:    Glucose, Bld 129 (*)    BUN 29 (*)    Creatinine, Ser 1.45 (*)    Alkaline Phosphatase 132 (*)    GFR calc non Af Amer 32 (*)    GFR calc Af Amer 37 (*)    All other components within normal limits  CARDIAC PANEL(CRET KIN+CKTOT+MB+TROPI)  POCT I-STAT TROPONIN I  I-STAT TROPONIN I  URINALYSIS, ROUTINE W REFLEX MICROSCOPIC  URINE CULTURE   Dg Chest 2 View  04/17/2011  *RADIOLOGY REPORT*  Clinical Data: Shortness of breath.  Fever and weakness  CHEST - 2 VIEW  Comparison: 10/02/2010  Findings: There is a left chest wall pacer device with lead in the right atrial appendage and right ventricle.  The heart size appears normal.  There is no pleural effusion or pulmonary edema.  Lungs are hyperinflated and there are diffusely coarsened interstitial markings bilaterally.  IMPRESSION:  1.  No acute cardiopulmonary abnormalities. 2.  COPD.,  Original Report Authenticated By: Rosealee Albee, M.D.     No diagnosis found.    MDM  Patient is seen and examined, initial history and physical is completed. Evaluation initiated   Results for orders placed during the hospital encounter of 04/17/11  CARDIAC PANEL(CRET KIN+CKTOT+MB+TROPI)      Component Value Range   Total CK 17  7 - 177 (U/L)   CK, MB 1.6  0.3 - 4.0 (ng/mL)   Troponin I <0.30  <0.30 (ng/mL)   Relative Index RELATIVE INDEX IS INVALID  0.0 - 2.5   CBC      Component Value Range   WBC 9.2  4.0 - 10.5 (K/uL)   RBC 3.87  3.87 - 5.11 (MIL/uL)   Hemoglobin 10.1 (*) 12.0 - 15.0 (g/dL)   HCT 16.1 (*) 09.6 - 46.0 (%)   MCV 85.3  78.0 - 100.0 (fL)   MCH 26.1  26.0 - 34.0 (pg)   MCHC 30.6  30.0 - 36.0 (g/dL)   RDW 04.5 (*) 40.9 - 15.5 (%)   Platelets 379  150 - 400  (K/uL)  DIFFERENTIAL      Component Value Range   Neutrophils Relative 78 (*) 43 - 77 (%)   Neutro Abs 7.2  1.7 - 7.7 (K/uL)   Lymphocytes Relative 11 (*) 12 - 46 (%)   Lymphs Abs 1.0  0.7 - 4.0 (K/uL)   Monocytes Relative 9  3 - 12 (%)  Monocytes Absolute 0.9  0.1 - 1.0 (K/uL)   Eosinophils Relative 2  0 - 5 (%)   Eosinophils Absolute 0.2  0.0 - 0.7 (K/uL)   Basophils Relative 0  0 - 1 (%)   Basophils Absolute 0.0  0.0 - 0.1 (K/uL)  COMPREHENSIVE METABOLIC PANEL      Component Value Range   Sodium 138  135 - 145 (mEq/L)   Potassium 3.6  3.5 - 5.1 (mEq/L)   Chloride 101  96 - 112 (mEq/L)   CO2 24  19 - 32 (mEq/L)   Glucose, Bld 129 (*) 70 - 99 (mg/dL)   BUN 29 (*) 6 - 23 (mg/dL)   Creatinine, Ser 6.21 (*) 0.50 - 1.10 (mg/dL)   Calcium 9.9  8.4 - 30.8 (mg/dL)   Total Protein 8.3  6.0 - 8.3 (g/dL)   Albumin 3.6  3.5 - 5.2 (g/dL)   AST 11  0 - 37 (U/L)   ALT <5  0 - 35 (U/L)   Alkaline Phosphatase 132 (*) 39 - 117 (U/L)   Total Bilirubin 0.3  0.3 - 1.2 (mg/dL)   GFR calc non Af Amer 32 (*) >90 (mL/min)   GFR calc Af Amer 37 (*) >90 (mL/min)  POCT I-STAT TROPONIN I      Component Value Range   Troponin i, poc 0.00  0.00 - 0.08 (ng/mL)   Comment 3            Dg Chest 2 View  04/17/2011  *RADIOLOGY REPORT*  Clinical Data: Shortness of breath.  Fever and weakness  CHEST - 2 VIEW  Comparison: 10/02/2010  Findings: There is a left chest wall pacer device with lead in the right atrial appendage and right ventricle.  The heart size appears normal.  There is no pleural effusion or pulmonary edema.  Lungs are hyperinflated and there are diffusely coarsened interstitial markings bilaterally.  IMPRESSION:  1.  No acute cardiopulmonary abnormalities. 2.  COPD.,  Original Report Authenticated By: Rosealee Albee, M.D.     Date: 04/17/2011  Rate: 76  Rhythm: normal sinus rhythm  QRS Axis: left  Intervals: normal and LVH  ST/T Wave abnormalities: nonspecific T wave changes  Conduction  Disutrbances:none  Narrative Interpretation:   Old EKG Reviewed: none available   3:21 PM Initial lab studies showed normal electrolytes, slightly elevated, creatinine, which appears to be at baseline, mildly elevated, alkaline phosphatase, normal cardiac enzymes.  Chest x-ray showed no acute abnormalities with COPD. Urinalysis still pending.  When VQ scan has been ordered to rule out pulmonary embolus. [facility] has been paged as the patient will still likely need admission for weakness and failure to thrive. [facility] consultation pending  3:41 PM   Discussed with [facility], Dr Allen Norris to admit.         [place] Zou A. Patrica Duel, MD 04/17/11 1541

## 2011-04-17 NOTE — Progress Notes (Addendum)
Pt. Arrived on the floor accompanied by her daughter. She was alert and oriented. Using 2L O2 via [place]. No apparent distress Her daughter states the pt. Has lost 6 lbs in 5 days and she was slurring her speech the last few weeks. She also has been experiencing nose bleeds and hallucinations. [name] [name] used to be a heavy smoker for years but she quit last May 2012. The pt. Also has had her RT. Kidney removed. Pt. Has had a pneumonia vaccine 09/2010 and a Flu vaccine 12/2010.

## 2011-04-17 NOTE — ED Notes (Signed)
Pt seen at PCP yesterday for disorientation, poor appetite and sleeping a lot since last week approx. Blood work performed and daughter called today and told to bring pt to ED to eval for PE or PNA. O2 in office was 91% and 88% at home last night. Pt denies shob, chest pain, sts she feels "pretty good." Pt A&Ox4 at present.

## 2011-04-17 NOTE — H&P (Signed)
PCP:   Pearla Dubonnet, MD, MD   Subspecialists involved  Chief Complaint:  78 yr old CF in the past week has been disorineted and has been confused, lost 6 pounds in the past week and recently when seen at PCP, her pressure went up.  Has been hallucinating and confused-she states that her daughter puts a picture of her mother up on the wall, and has been gluing rags up on the ceiling.  Some bloodwork was done there, and a pulse ox was done yesterday, and the bloodwork returned and Dr. Kevan Ny wanted her admitted for a work-up of this.  Lived in [place] till the end of August and called daughter on May 1st and was brought here and was dehydrated and hiospitlized for 10 days-had a stent in the femoral artery and a mesenteric stent as well-lost her kidney due to ischemia Lives with dauhter and has declined over 1 week.  No fever or chills, but has put on summer pajamas instead of regular [pyjama's.  4 nights of really bad hallucinations.    Review of Systems:  The patient denies anorexia, fever, weight loss,, vision loss, decreased hearing, hoarseness, chest pain, syncope, dyspnea on exertion, peripheral edema, balance deficits, hemoptysis, abdominal pain, melena, hematochezia, severe indigestion/heartburn, hematuria, incontinence, genital sores, muscle weakness, suspicious skin lesions, transient blindness, difficulty walking, depression, unusual weight change, abnormal bleeding, enlarged lymph nodes, angioedema, and breast masses.  Past Medical History: Past Medical History  Diagnosis Date  . Mesenteric ischemia, chronic   . Chronic total occlusion of artery of the extremities   . Tachy-brady syndrome   . Anemia   . Hypertension   . Paroxysmal supraventricular tachycardia   . SBO (small bowel obstruction)     history partial  . Diverticulosis     Past surgical history: Past Surgical History  Procedure Date  . Appendectomy   . Abdominal hysterectomy   . Pacemaker insertion     . Iliac artery stent 07/30/10    Left [facility] with angiogram    Medications: Prior to Admission medications   Medication Sig Start Date End Date Taking? Authorizing Provider  atenolol (TENORMIN) 50 MG tablet Take 50 mg by mouth daily.     Yes Historical Provider, MD  clopidogrel (PLAVIX) 75 MG tablet Take 75 mg by mouth daily.     Yes Historical Provider, MD  diphenhydramine-acetaminophen (TYLENOL PM) 25-500 MG TABS Take 2 tablets by mouth at bedtime as needed. For pain/sleep.   Yes Historical Provider, MD  losartan (COZAAR) 100 MG tablet Take 50 mg by mouth daily.    Yes Historical Provider, MD    Allergies:  No Known Allergies  Social History:  reports that she quit smoking about 9 months ago. Her smoking use included Cigarettes. She has a 65 pack-year smoking history. She does not have any smokeless tobacco history on file. She reports that she does not drink alcohol or use illicit drugs.  Family History: History reviewed. No pertinent family history.  Physical Exam: Filed Vitals:   04/17/11 1224 04/17/11 1313  BP: 178/86 148/52  Pulse: 77 70  Temp: 98.1 F (36.7 C) 97.9 F (36.6 C)  TempSrc: Oral Oral  Resp: 20 18  Weight: 48.081 kg (106 lb)   SpO2:  100%    HEENT-alert pleasant Caucasian female able to relate most of a Mini-Mental status other than the operative function such as using a pen and folding paper. Very frail-appearing  CHEST-clinically clear no tactile vocal resonance and fremitus CARDS-S1-S2 no  murmurs rubs or gallop ABD-soft nontender nondistended normal bowel SKIN-no significant issue NEURO- once again neurologically seems intact and very aware and alert she could answer the time date the year season to county [place] she cannot tell if present   Labs on Admission:   [facility] 04/17/11 1246  NA 138  K 3.6  CL 101  CO2 24  GLUCOSE 129*  BUN 29*  CREATININE 1.45*  CALCIUM 9.9  MG --  PHOS --    Basename 04/17/11 1246  AST 11  ALT <5   ALKPHOS 132*  BILITOT 0.3  PROT 8.3  ALBUMIN 3.6   No results found for this basename: LIPASE:2,AMYLASE:2 in the last 72 hours  Basename 04/17/11 1246  WBC 9.2  NEUTROABS 7.2  HGB 10.1*  HCT 33.0*  MCV 85.3  PLT 379    Basename 04/17/11 1246  CKTOTAL 17  CKMB 1.6  CKMBINDEX --  TROPONINI <0.30   No results found for this basename: TSH,T4TOTAL,FREET3,T3FREE,THYROIDAB in the last 72 hours No results found for this basename: VITAMINB12:2,FOLATE:2,FERRITIN:2,TIBC:2,IRON:2,RETICCTPCT:2 in the last 72 hours  Radiological Exams on Admission: Dg Chest 2 View  04/17/2011  *RADIOLOGY REPORT*  Clinical Data: Shortness of breath.  Fever and weakness  CHEST - 2 VIEW  Comparison: 10/02/2010  Findings: There is a left chest wall pacer device with lead in the right atrial appendage and right ventricle.  The heart size appears normal.  There is no pleural effusion or pulmonary edema.  Lungs are hyperinflated and there are diffusely coarsened interstitial markings bilaterally.  IMPRESSION:  1.  No acute cardiopulmonary abnormalities. 2.  COPD.,  Original Report Authenticated By: Rosealee Albee, M.D.   Nm Pulmonary Per & Vent  04/17/2011  *RADIOLOGY REPORT*  Clinical Data:  Shortness of breath, COPD  NUCLEAR MEDICINE VENTILATION - PERFUSION LUNG SCAN  Technique:  Wash-in, equilibrium, and wash-out phase ventilation images were obtained using Xe-133 gas.  Perfusion images were obtained in multiple projections after intravenous injection of Tc- 52m MAA.  Radiopharmaceuticals:  5.4 mCi Xe-133 gas and 5.0 mCi Tc-21m MAA.  Comparison:  None Correlation:  Chest radiograph 04/17/2011  Findings: Ventilation exam in anterior and posterior projections demonstrates normal enlargement cardiac silhouette.  Normal single breath ventilation.  Mild xenon retention at lower lungs.  No focal ventilatory defects.  Perfusion lung scan in eight projections demonstrates minimal peripheral irregularity of perfusion in the  left lung in a nonsegmental fashion.  Heart appears enlarged.  Defect at the anterior base on the lateral view corresponding to a thickening or fluid at the right major fissure on chest radiograph.  No segmental or subsegmental perfusion defects.  Chest radiograph demonstrates COPD and enlargement of cardiac silhouette with evidence of a pacemaker.  IMPRESSION: Very low probability for pulmonary embolism.  Original Report Authenticated By: Lollie Marrow, M.D.    Assessment/Plan #1 hypoxia and confusion-I believe that these are related.  Her shortness of breath B. new onset and she has 60-pack-year history of smoking. She did came in with O2 sats in the 60s on admission and will probably need long-term oxygen. A limited workup has been done with chest x-ray/VQ scan which did not show any specific findings. I believe she may benefit from testing for sleep apnea as this may be one of the reasons why she drops her O2 sats. Believe the rest of workup to Dr. Kevan Ny who obviously knows her better. We'll nevertheless get a TSH B12  #2 mild volume depletion she has a BUN/creatinine  of twenty-nine over one 0.45 and we will give her IV fluids at 50 cc an hour overnight  #3 history of multiple vasculopathic issues including loss of the kidney from chronic mesenteric ischemia-she will need to continue her Plavix.  Carotid ultrasounds have not been done but probably evaluate the same as she could have also some  decreased perfusion to the brain causing some confusion.  Rest of clinical problems including tachybradycardia syndrome hypertension anemia diverticulosis are all relatively stable at this time and patient will be seen by Dr. Kevan Ny or partners in the morning     [facility] 04/17/2011, 4:30 PM

## 2011-04-17 NOTE — ED Notes (Signed)
Pt transported to [facility] 

## 2011-04-17 NOTE — ED Notes (Signed)
Pt has bed assignment. Samtani, MD made aware and requests time to go see pt before pt is moved to floor.

## 2011-04-18 DIAGNOSIS — R9431 Abnormal electrocardiogram [ECG] [EKG]: Secondary | ICD-10-CM | POA: Diagnosis not present

## 2011-04-18 DIAGNOSIS — J449 Chronic obstructive pulmonary disease, unspecified: Secondary | ICD-10-CM | POA: Diagnosis not present

## 2011-04-18 DIAGNOSIS — D62 Acute posthemorrhagic anemia: Secondary | ICD-10-CM | POA: Diagnosis not present

## 2011-04-18 DIAGNOSIS — K1370 Unspecified lesions of oral mucosa: Secondary | ICD-10-CM | POA: Diagnosis not present

## 2011-04-18 DIAGNOSIS — K121 Other forms of stomatitis: Secondary | ICD-10-CM | POA: Diagnosis present

## 2011-04-18 DIAGNOSIS — F29 Unspecified psychosis not due to a substance or known physiological condition: Secondary | ICD-10-CM | POA: Diagnosis not present

## 2011-04-18 DIAGNOSIS — I7771 Dissection of carotid artery: Secondary | ICD-10-CM | POA: Diagnosis not present

## 2011-04-18 LAB — COMPREHENSIVE METABOLIC PANEL
ALT: <5 U/L (ref 0–35)
AST: 10 U/L (ref 0–37)
Albumin: 3 g/dL — ABNORMAL LOW (ref 3.5–5.2)
Calcium: 9.6 mg/dL (ref 8.4–10.5)
Creatinine, Ser: 1.36 mg/dL — ABNORMAL HIGH (ref 0.50–1.10)
Sodium: 137 mEq/L (ref 135–145)

## 2011-04-18 LAB — IRON AND TIBC
Iron: 13 ug/dL — ABNORMAL LOW (ref 42–135)
Saturation Ratios: 4 % — ABNORMAL LOW (ref 20–55)
TIBC: 316 ug/dL (ref 250–470)

## 2011-04-18 LAB — CBC
MCH: 25.8 pg — ABNORMAL LOW (ref 26.0–34.0)
MCV: 84.4 fL (ref 78.0–100.0)
Platelets: 359 10*3/uL (ref 150–400)
RDW: 16.3 % — ABNORMAL HIGH (ref 11.5–15.5)
WBC: 9.8 10*3/uL (ref 4.0–10.5)

## 2011-04-18 LAB — URINE CULTURE
Colony Count: NO GROWTH
Culture  Setup Time: 201301260132

## 2011-04-18 LAB — VITAMIN B12: Vitamin B-12: 890 pg/mL (ref 211–911)

## 2011-04-18 LAB — RPR: RPR Ser Ql: NONREACTIVE

## 2011-04-18 LAB — PROTIME-INR: INR: 1.07 (ref 0.00–1.49)

## 2011-04-18 MED ORDER — ACYCLOVIR 200 MG PO CAPS
200.0000 mg | ORAL_CAPSULE | Freq: Every day | ORAL | Status: DC
  Administered 2011-04-18 – 2011-04-20 (×9): 200 mg via ORAL
  Filled 2011-04-18 (×14): qty 1

## 2011-04-18 MED ORDER — MAGIC MOUTHWASH
5.0000 mL | Freq: Four times a day (QID) | ORAL | Status: DC
  Administered 2011-04-18 – 2011-04-20 (×7): 5 mL via ORAL
  Filled 2011-04-18 (×11): qty 5

## 2011-04-18 MED ORDER — BUDESONIDE-FORMOTEROL FUMARATE 80-4.5 MCG/ACT IN AERO
2.0000 | INHALATION_SPRAY | Freq: Two times a day (BID) | RESPIRATORY_TRACT | Status: DC
  Administered 2011-04-18 – 2011-04-20 (×5): 2 via RESPIRATORY_TRACT
  Filled 2011-04-18: qty 6.9

## 2011-04-18 MED ORDER — TIOTROPIUM BROMIDE MONOHYDRATE 18 MCG IN CAPS
18.0000 ug | ORAL_CAPSULE | Freq: Every day | RESPIRATORY_TRACT | Status: DC
  Administered 2011-04-18 – 2011-04-20 (×3): 18 ug via RESPIRATORY_TRACT
  Filled 2011-04-18: qty 5

## 2011-04-18 NOTE — Progress Notes (Signed)
Carotid duplex completed. Right -  No evidence of significant ICA stenosis. Moderate ECA stenosis. Vertebral artery flow is antegrade. Left - 40% to 59% ICA stenosis low to mid range. Moderate ECA stenosis. Vertebral artery flow is retrograde.   Rayann Jolley, [place] D., RVS 04/18/2011 10:58 AM

## 2011-04-18 NOTE — Progress Notes (Signed)
Cm spoke with daughter at bedside who is pt's caregiver. Per daughter [facility] to provide home oxygen if pt qualifies upon discharge. Per daughter pt has home DME, no HH services requested. [facility] notified of new referral.   Kamarii Buren,RN,bsn [phone]

## 2011-04-18 NOTE — Progress Notes (Signed)
Subjective: [name] feels fine today. She apparently did not get confused last night on oxygen. She does have a relatively low hemoglobin today but has had a few nosebleeds in the past week or so. She has no pain and otherwise is feeling well. Ventilation perfusion scan revealed no obvious pulmonary embolus. Her daughter states that she does get a lot of mucus at times. She is complaining of comes soreness on her upper mid gum  Objective: Weight change:   Intake/Output Summary (Last 24 hours) at 04/18/11 1052 Last data filed at 04/18/11 0200  Gross per 24 hour  Intake    240 ml  Output    200 ml  Net     40 ml   Filed Vitals:   04/17/11 1820 04/17/11 2050 04/17/11 2100 04/18/11 0603  BP: 104/54 171/78  170/79  Pulse: 80 85  85  Temp: 98.2 F (36.8 C) 98.1 F (36.7 C)  99.1 F (37.3 C)  TempSrc: Oral Oral  Oral  Resp: 18 18  16   Height:   5\' 3"  (1.6 m)   Weight:   51.256 kg (113 lb)   SpO2: 100% 100%  94%   HEENT- 2 x 2 right postauricular cyst, mildly tender but noninflamed. Oral cavity with ulceration at the area of the frenulum at the top of the maxillary gum centrally.  CHEST- decreased breath sounds throughout but clear to auscultation in all lobar distributions CARDS-S1-S2 no murmurs rubs or gallop  ABD-soft nontender nondistended normal bowel  SKIN-no significant issue  NEURO- nonfocal. Oriented x3. And has confusion at night according to her daughter  Lab Results:  [facility] 04/18/11 0410 04/17/11 1246  NA 137 138  K 4.2 3.6  CL 104 101  CO2 23 24  GLUCOSE 95 129*  BUN 31* 29*  CREATININE 1.36* 1.45*  CALCIUM 9.6 9.9  MG -- --  PHOS -- --    Basename 04/18/11 0410 04/17/11 1246  AST 10 11  ALT <5 <5  ALKPHOS 113 132*  BILITOT 0.3 0.3  PROT 7.0 8.3  ALBUMIN 3.0* 3.6   No results found for this basename: LIPASE:2,AMYLASE:2 in the last 72 hours  Basename 04/18/11 0410 04/17/11 1246  WBC 9.8 9.2  NEUTROABS -- 7.2  HGB 8.6* 10.1*  HCT 28.1* 33.0*  MCV  84.4 85.3  PLT 359 379    Basename 04/17/11 1246  CKTOTAL 17  CKMB 1.6  CKMBINDEX --  TROPONINI <0.30   No components found with this basename: POCBNP:3 No results found for this basename: DDIMER:2 in the last 72 hours No results found for this basename: HGBA1C:2 in the last 72 hours No results found for this basename: CHOL:2,HDL:2,LDLCALC:2,TRIG:2,CHOLHDL:2,LDLDIRECT:2 in the last 72 hours  Basename 04/17/11 1600  TSH 0.310*  T4TOTAL --  T3FREE --  THYROIDAB --    Basename 04/17/11 1600  VITAMINB12 890  FOLATE --  FERRITIN --  TIBC --  IRON --  RETICCTPCT --    Studies/Results: Dg Chest 2 View  04/17/2011  *RADIOLOGY REPORT*  Clinical Data: Shortness of breath.  Fever and weakness  CHEST - 2 VIEW  Comparison: 10/02/2010  Findings: There is a left chest wall pacer device with lead in the right atrial appendage and right ventricle.  The heart size appears normal.  There is no pleural effusion or pulmonary edema.  Lungs are hyperinflated and there are diffusely coarsened interstitial markings bilaterally.  IMPRESSION:  1.  No acute cardiopulmonary abnormalities. 2.  COPD.,  Original Report Authenticated By: Ladona Ridgel  H. Bradly Chris, M.D.   Nm Pulmonary Per & Vent  04/17/2011  *RADIOLOGY REPORT*  Clinical Data:  Shortness of breath, COPD  NUCLEAR MEDICINE VENTILATION - PERFUSION LUNG SCAN  Technique:  Wash-in, equilibrium, and wash-out phase ventilation images were obtained using Xe-133 gas.  Perfusion images were obtained in multiple projections after intravenous injection of Tc- 72m MAA.  Radiopharmaceuticals:  5.4 mCi Xe-133 gas and 5.0 mCi Tc-65m MAA.  Comparison:  None Correlation:  Chest radiograph 04/17/2011  Findings: Ventilation exam in anterior and posterior projections demonstrates normal enlargement cardiac silhouette.  Normal single breath ventilation.  Mild xenon retention at lower lungs.  No focal ventilatory defects.  Perfusion lung scan in eight projections demonstrates  minimal peripheral irregularity of perfusion in the left lung in a nonsegmental fashion.  Heart appears enlarged.  Defect at the anterior base on the lateral view corresponding to a thickening or fluid at the right major fissure on chest radiograph.  No segmental or subsegmental perfusion defects.  Chest radiograph demonstrates COPD and enlargement of cardiac silhouette with evidence of a pacemaker.  IMPRESSION: Very low probability for pulmonary embolism.  Original Report Authenticated By: Lollie Marrow, M.D.   Medications: Scheduled Meds:   . atenolol  50 mg Oral Daily  . clopidogrel  75 mg Oral Q breakfast  . losartan  50 mg Oral Daily   Continuous Infusions:  PRN Meds:.acetaminophen, acetaminophen, ondansetron (ZOFRAN) IV, ondansetron, technetium albumin aggregated, xenon xe 133, zolpidem, DISCONTD: zolpidem  Assessment/Plan: Patient Active Problem List  Diagnoses Date Noted  . Left ventricular hypertrophy -  04/18/2011  . Confusion - appears to be better on nasal cannula O2. Will start continuous pulse oximetry and discontinue oxygen if O2 sats range and scar 90% or greater on room air. Resume nasal cannula O2 the O2 saturations drop below 87% on room air. Muscle doubletime appears to be nocturnally  04/18/2011  . Atherosclerosis of native arteries of the extremities with intermittent claudication 04/03/2011  . Chronic vascular insufficiency of intestine 04/03/2011  . Atherosclerosis of renal artery 04/03/2011  . Peripheral arterial occlusive disease - carotid Dopplers today reveal no obvious ICA stenosis that is significant. She does have retrograde flow in the left vertebral artery  08/29/2010  . Chronic mesenteric ischemia 08/29/2010  . Renal artery stenosis COPD- start Spiriva and Symbicort Anemia- check iron studies and B12. Hemocculting  stool would not be reasonable right now because she has had nosebleeds - check CBC in a.m. for stability Oral ulcer- appears to be a canker sore.  Could be herpes simplex. Will treat with Magic mouthwash and acyclovir  08/29/2010     LOS: 1 day   [name] [name] 04/18/2011, 10:52 AM

## 2011-04-19 DIAGNOSIS — K1370 Unspecified lesions of oral mucosa: Secondary | ICD-10-CM | POA: Diagnosis not present

## 2011-04-19 DIAGNOSIS — R9431 Abnormal electrocardiogram [ECG] [EKG]: Secondary | ICD-10-CM | POA: Diagnosis not present

## 2011-04-19 DIAGNOSIS — D62 Acute posthemorrhagic anemia: Secondary | ICD-10-CM | POA: Diagnosis not present

## 2011-04-19 DIAGNOSIS — I701 Atherosclerosis of renal artery: Secondary | ICD-10-CM | POA: Insufficient documentation

## 2011-04-19 DIAGNOSIS — J449 Chronic obstructive pulmonary disease, unspecified: Secondary | ICD-10-CM | POA: Diagnosis not present

## 2011-04-19 DIAGNOSIS — F29 Unspecified psychosis not due to a substance or known physiological condition: Secondary | ICD-10-CM | POA: Diagnosis not present

## 2011-04-19 LAB — CBC
HCT: 28.8 % — ABNORMAL LOW (ref 36.0–46.0)
MCHC: 30.2 g/dL (ref 30.0–36.0)
Platelets: 373 10*3/uL (ref 150–400)
RDW: 16.5 % — ABNORMAL HIGH (ref 11.5–15.5)

## 2011-04-19 LAB — DIFFERENTIAL
Basophils Absolute: 0 10*3/uL (ref 0.0–0.1)
Basophils Relative: 1 % (ref 0–1)
Monocytes Absolute: 1.2 10*3/uL — ABNORMAL HIGH (ref 0.1–1.0)
Neutro Abs: 5.5 10*3/uL (ref 1.7–7.7)
Neutrophils Relative %: 64 % (ref 43–77)

## 2011-04-19 LAB — BASIC METABOLIC PANEL
Chloride: 104 mEq/L (ref 96–112)
Creatinine, Ser: 1.26 mg/dL — ABNORMAL HIGH (ref 0.50–1.10)
GFR calc Af Amer: 44 mL/min — ABNORMAL LOW (ref >90)

## 2011-04-19 LAB — PRO B NATRIURETIC PEPTIDE: Pro B Natriuretic peptide (BNP): 756.9 pg/mL — ABNORMAL HIGH (ref 0–450)

## 2011-04-19 NOTE — Progress Notes (Signed)
Pt walked in hall with out oxygen on for 200 ft, O2 saturation was 96-98%.

## 2011-04-19 NOTE — Progress Notes (Signed)
Subjective: [name] [name] is feeling better today. Mouth lesion feels better. Breathing well on nasal cannula O2. Need to clarify what happened last night in terms of her 02 saturation and whether she needs to be on nasal cannula oxygen or not.  Objective: Weight change:   Intake/Output Summary (Last 24 hours) at 04/19/11 1024 Last data filed at 04/18/11 2200  Gross per 24 hour  Intake    240 ml  Output    300 ml  Net    -60 ml   Filed Vitals:   04/18/11 2114 04/18/11 2130 04/19/11 0505 04/19/11 0842  BP:  139/65 162/67   Pulse:  80 74   Temp:  98 F (36.7 C) 98.2 F (36.8 C)   TempSrc:  Oral Oral   Resp:  18 18   Height:      Weight:      SpO2: 95% 96% 97% 95%   HEENT- 2 x 2 right postauricular cyst, mildly tender but noninflamed. Oral cavity with ulceration at the area of the frenulum at the top of the maxillary gum centrally - less tender today, improving  CHEST- decreased breath sounds throughout but clear to auscultation in all lobar distributions  CARDS-S1-S2 no murmurs rubs or gallop  ABD-soft nontender nondistended normal bowel  SKIN-no significant issue  NEURO- nonfocal. Oriented x3. And has confusion at night according to her daughter    Lab Results:  [facility] 04/19/11 0415 04/18/11 0410  NA 138 137  K 4.4 4.2  CL 104 104  CO2 26 23  GLUCOSE 90 95  BUN 28* 31*  CREATININE 1.26* 1.36*  CALCIUM 9.8 9.6  MG -- --  PHOS -- --    Basename 04/18/11 0410 04/17/11 1246  AST 10 11  ALT <5 <5  ALKPHOS 113 132*  BILITOT 0.3 0.3  PROT 7.0 8.3  ALBUMIN 3.0* 3.6   No results found for this basename: LIPASE:2,AMYLASE:2 in the last 72 hours  Basename 04/19/11 0415 04/18/11 0410 04/17/11 1246  WBC 8.7 9.8 --  NEUTROABS 5.5 -- 7.2  HGB 8.7* 8.6* --  HCT 28.8* 28.1* --  MCV 84.2 84.4 --  PLT 373 359 --    Basename 04/17/11 1246  CKTOTAL 17  CKMB 1.6  CKMBINDEX --  TROPONINI <0.30   No components found with this basename: POCBNP:3 No results found for this  basename: DDIMER:2 in the last 72 hours No results found for this basename: HGBA1C:2 in the last 72 hours No results found for this basename: CHOL:2,HDL:2,LDLCALC:2,TRIG:2,CHOLHDL:2,LDLDIRECT:2 in the last 72 hours  Basename 04/17/11 1600  TSH 0.310*  T4TOTAL --  T3FREE --  THYROIDAB --    Basename 04/18/11 1210 04/17/11 1600  VITAMINB12 -- 890  FOLATE -- --  FERRITIN -- --  TIBC 316 --  IRON 13* --  RETICCTPCT -- --    Studies/Results: Dg Chest 2 View  04/17/2011  *RADIOLOGY REPORT*  Clinical Data: Shortness of breath.  Fever and weakness  CHEST - 2 VIEW  Comparison: 10/02/2010  Findings: There is a left chest wall pacer device with lead in the right atrial appendage and right ventricle.  The heart size appears normal.  There is no pleural effusion or pulmonary edema.  Lungs are hyperinflated and there are diffusely coarsened interstitial markings bilaterally.  IMPRESSION:  1.  No acute cardiopulmonary abnormalities. 2.  COPD.,  Original Report Authenticated By: Rosealee Albee, M.D.   Nm Pulmonary Per & Vent  04/17/2011  *RADIOLOGY REPORT*  Clinical Data:  Shortness of  breath, COPD  NUCLEAR MEDICINE VENTILATION - PERFUSION LUNG SCAN  Technique:  Wash-in, equilibrium, and wash-out phase ventilation images were obtained using Xe-133 gas.  Perfusion images were obtained in multiple projections after intravenous injection of Tc- 32m MAA.  Radiopharmaceuticals:  5.4 mCi Xe-133 gas and 5.0 mCi Tc-51m MAA.  Comparison:  None Correlation:  Chest radiograph 04/17/2011  Findings: Ventilation exam in anterior and posterior projections demonstrates normal enlargement cardiac silhouette.  Normal single breath ventilation.  Mild xenon retention at lower lungs.  No focal ventilatory defects.  Perfusion lung scan in eight projections demonstrates minimal peripheral irregularity of perfusion in the left lung in a nonsegmental fashion.  Heart appears enlarged.  Defect at the anterior base on the lateral  view corresponding to a thickening or fluid at the right major fissure on chest radiograph.  No segmental or subsegmental perfusion defects.  Chest radiograph demonstrates COPD and enlargement of cardiac silhouette with evidence of a pacemaker.  IMPRESSION: Very low probability for pulmonary embolism.  Original Report Authenticated By: Lollie Marrow, M.D.   Medications: Scheduled Meds:   . acyclovir  200 mg Oral 5 X Daily  . atenolol  50 mg Oral Daily  . budesonide-formoterol  2 puff Inhalation BID  . clopidogrel  75 mg Oral Q breakfast  . losartan  50 mg Oral Daily  . magic mouthwash  5 mL Oral QID  . tiotropium  18 mcg Inhalation Daily   Continuous Infusions:  PRN Meds:.acetaminophen, acetaminophen, ondansetron (ZOFRAN) IV, ondansetron, zolpidem  Assessment/Plan:  Patient Active Problem List  Diagnoses  Date Noted  .  Left ventricular hypertrophy - aware 04/18/2011  .  Confusion - appears to be better on nasal cannula O2. Will start continuous pulse oximetry and discontinue oxygen if O2 sats range and scar 90% or greater on room air. Resume nasal cannula O2 the O2 saturations drop below 87% on room air.  Will check O2 saturations before and after ambulation today. We'll also check them at night on room air and nursing has been instructed to replace nasal cannula O2 his O2 saturations drop below 87%. We are trying to clarify whether or not she needs home oxygen and whether or not hypoxemia is causing confusion at night.  04/18/2011  .  Atherosclerosis of native arteries of the extremities with intermittent claudication  04/03/2011  .  Chronic vascular insufficiency of intestine  04/03/2011  .  Atherosclerosis of renal artery  04/03/2011  .  Peripheral arterial occlusive disease - carotid Dopplers today reveal no obvious ICA stenosis that is significant. She does have retrograde flow in the left vertebral artery. She also has a 40-59% ICA stenosis on the left  08/29/2010  .    Chronic mesenteric ischemia - eating well without pain - may be contributing to weight loss 08/29/2010   . Renal artery stenosis - history of left renal artery stenting  COPD- continue Spiriva and Symbicort   Anemia -  iron studies are low and will start iron therapy. B12 is normal. Hemoglobin may be slightly low because of recent nosebleeds. Will start iron twice a day   Oral ulcer- appears to be a canker sore. Could be herpes simplex. Symptomatically improving on Magic mouthwash and acyclovir  08/29/2010    LOS: 2 days   [name] [name] 04/19/2011, 10:24 AM

## 2011-04-20 DIAGNOSIS — F29 Unspecified psychosis not due to a substance or known physiological condition: Secondary | ICD-10-CM | POA: Diagnosis not present

## 2011-04-20 DIAGNOSIS — D62 Acute posthemorrhagic anemia: Secondary | ICD-10-CM | POA: Diagnosis not present

## 2011-04-20 DIAGNOSIS — J449 Chronic obstructive pulmonary disease, unspecified: Secondary | ICD-10-CM | POA: Diagnosis not present

## 2011-04-20 DIAGNOSIS — R9431 Abnormal electrocardiogram [ECG] [EKG]: Secondary | ICD-10-CM | POA: Diagnosis not present

## 2011-04-20 DIAGNOSIS — K1370 Unspecified lesions of oral mucosa: Secondary | ICD-10-CM | POA: Diagnosis not present

## 2011-04-20 MED ORDER — TIOTROPIUM BROMIDE MONOHYDRATE 18 MCG IN CAPS: 18.0000 ug | ORAL_CAPSULE | Freq: Every day | RESPIRATORY_TRACT | Status: DC

## 2011-04-20 MED ORDER — ACYCLOVIR 200 MG PO CAPS: 200.0000 mg | ORAL_CAPSULE | Freq: Every day | ORAL | Status: AC

## 2011-04-20 MED ORDER — BUDESONIDE-FORMOTEROL FUMARATE 80-4.5 MCG/ACT IN AERO: 2.0000 | INHALATION_SPRAY | Freq: Two times a day (BID) | RESPIRATORY_TRACT | Status: DC

## 2011-04-20 MED ORDER — FERROUS SULFATE 325 (65 FE) MG PO TABS: 325.0000 mg | ORAL_TABLET | Freq: Two times a day (BID) | ORAL | Status: DC

## 2011-04-20 NOTE — Discharge Summary (Signed)
Physician Discharge Summary  NAME:Cherylyn Reasor  ZOX:[id number]  DOB: 11-18-1923   Admit date: 04/17/2011 Discharge date: 04/20/2011  Discharge Diagnoses:  Active Problems:  Left ventricular hypertrophy - noted on EKG  Confusion - resolved. May have been secondary to hypoxemia while not taking COPD medications  Anemia associated with acute blood loss - may have been secondary to epistaxis prior to admission. Iron is mildly low and we'll treat with iron on discharge  COPD (chronic obstructive pulmonary disease) with chronic bronchitis - she is not dropping her oxygen saturations during the hospitalization. Will check pulse oximetry results at home that were performed last week.  Oral ulcer - symptomatically improving on acyclovir  H/O epistaxis - aware   Discharge Condition: Improved  Hospital Course: Mrs. Bing presented on January 25 with disorientation and confusion and some weight loss. She had been hallucinating and being very confused at night and there were episodes of hypoxemia as evidenced on her admission this hospitalization with PO2 being 60 and 70%. However, since admission, she has done well with no drops in oxygen saturations on good pulmonary care. No evidence that she needs home oxygen at this time. While admitted, she was found to be somewhat anemic which may be secondary to epistaxis. We'll be starting her on iron at discharge and rechecking blood work in 2 weeks  Consults: Noncontributory  Disposition: Home  Discharge Orders    Future Appointments: Provider: Department: Dept Phone: Center:   10/09/2011 9:00 AM [facility] [phone] [facility]   10/09/2011 9:30 AM [facility] [phone] [facility]   10/09/2011 10:30 AM [facility] [phone] [facility]   10/09/2011 11:30 AM [facility] [phone] [facility]   10/09/2011 12:30 PM Nilda Simmer, MD [facility] [phone] [facility]     Future Orders Please Complete  By Expires   Diet - low sodium heart healthy      Increase activity slowly        Medication List  As of 04/20/2011  6:11 AM   STOP taking these medications         diphenhydramine-acetaminophen 25-500 MG Tabs         TAKE these medications         acyclovir 200 MG capsule   Commonly known as: ZOVIRAX   Take 1 capsule (200 mg total) by mouth 5 (five) times daily.      atenolol 50 MG tablet   Commonly known as: TENORMIN   Take 50 mg by mouth daily.      budesonide-formoterol 80-4.5 MCG/ACT inhaler   Commonly known as: SYMBICORT   Inhale 2 puffs into the lungs 2 (two) times daily.      clopidogrel 75 MG tablet   Commonly known as: PLAVIX   Take 75 mg by mouth daily.      losartan 100 MG tablet   Commonly known as: COZAAR   Take 50 mg by mouth daily.      tiotropium 18 MCG inhalation capsule   Commonly known as: SPIRIVA   Place 1 capsule (18 mcg total) into inhaler and inhale daily.           Follow-up Information    Follow up with Bulmaro Feagans NEVILL, MD in 2 weeks. (Call M.D. if not feeling well at home)          Things to follow up in the outpatient setting: Monitor for confusion and shortness of breath  Time coordinating discharge: 35 minutes  The results of significant  diagnostics from this hospitalization (including imaging, microbiology, ancillary and laboratory) are listed below for reference.    Significant Diagnostic Studies: Dg Chest 2 View  04/17/2011  *RADIOLOGY REPORT*  Clinical Data: Shortness of breath.  Fever and weakness  CHEST - 2 VIEW  Comparison: 10/02/2010  Findings: There is a left chest wall pacer device with lead in the right atrial appendage and right ventricle.  The heart size appears normal.  There is no pleural effusion or pulmonary edema.  Lungs are hyperinflated and there are diffusely coarsened interstitial markings bilaterally.  IMPRESSION:  1.  No acute cardiopulmonary abnormalities. 2.  COPD.,  Original Report Authenticated By:  Rosealee Albee, M.D.   [facility]  04/17/2011  *RADIOLOGY REPORT*  Clinical Data:  Shortness of breath, COPD  NUCLEAR MEDICINE VENTILATION - PERFUSION LUNG SCAN  Technique:  Wash-in, equilibrium, and wash-out phase ventilation images were obtained using Xe-133 gas.  Perfusion images were obtained in multiple projections after intravenous injection of Tc- 76m MAA.  Radiopharmaceuticals:  5.4 mCi Xe-133 gas and 5.0 mCi Tc-30m MAA.  Comparison:  None Correlation:  Chest radiograph 04/17/2011  Findings: Ventilation exam in anterior and posterior projections demonstrates normal enlargement cardiac silhouette.  Normal single breath ventilation.  Mild xenon retention at lower lungs.  No focal ventilatory defects.  Perfusion lung scan in eight projections demonstrates minimal peripheral irregularity of perfusion in the left lung in a nonsegmental fashion.  Heart appears enlarged.  Defect at the anterior base on the lateral view corresponding to a thickening or fluid at the right major fissure on chest radiograph.  No segmental or subsegmental perfusion defects.  Chest radiograph demonstrates COPD and enlargement of cardiac silhouette with evidence of a pacemaker.  IMPRESSION: Very low probability for pulmonary embolism.  Original Report Authenticated By: Lollie Marrow, M.D.    Microbiology: Recent Results (from the past 240 hour(s))  URINE CULTURE     Status: Normal   Collection Time   04/17/11  4:24 PM      Component Value Range Status Comment   Specimen Description URINE, CATHETERIZED   Final    Special Requests NONE   Final    Setup Time [id number]   Final    Colony Count NO GROWTH   Final    Culture NO GROWTH   Final    Report Status 04/18/2011 FINAL   Final      Labs: Results for orders placed during the hospital encounter of 04/17/11  CARDIAC PANEL(CRET KIN+CKTOT+MB+TROPI)      Component Value Range   Total CK 17  7 - 177 (U/L)   CK, MB 1.6  0.3 - 4.0 (ng/mL)   Troponin I  <0.30  <0.30 (ng/mL)   Relative Index RELATIVE INDEX IS INVALID  0.0 - 2.5   CBC      Component Value Range   WBC 9.2  4.0 - 10.5 (K/uL)   RBC 3.87  3.87 - 5.11 (MIL/uL)   Hemoglobin 10.1 (*) 12.0 - 15.0 (g/dL)   HCT 56.2 (*) 13.0 - 46.0 (%)   MCV 85.3  78.0 - 100.0 (fL)   MCH 26.1  26.0 - 34.0 (pg)   MCHC 30.6  30.0 - 36.0 (g/dL)   RDW 86.5 (*) 78.4 - 15.5 (%)   Platelets 379  150 - 400 (K/uL)  DIFFERENTIAL      Component Value Range   Neutrophils Relative 78 (*) 43 - 77 (%)   Neutro Abs 7.2  1.7 -  7.7 (K/uL)   Lymphocytes Relative 11 (*) 12 - 46 (%)   Lymphs Abs 1.0  0.7 - 4.0 (K/uL)   Monocytes Relative 9  3 - 12 (%)   Monocytes Absolute 0.9  0.1 - 1.0 (K/uL)   Eosinophils Relative 2  0 - 5 (%)   Eosinophils Absolute 0.2  0.0 - 0.7 (K/uL)   Basophils Relative 0  0 - 1 (%)   Basophils Absolute 0.0  0.0 - 0.1 (K/uL)  COMPREHENSIVE METABOLIC PANEL      Component Value Range   Sodium 138  135 - 145 (mEq/L)   Potassium 3.6  3.5 - 5.1 (mEq/L)   Chloride 101  96 - 112 (mEq/L)   CO2 24  19 - 32 (mEq/L)   Glucose, Bld 129 (*) 70 - 99 (mg/dL)   BUN 29 (*) 6 - 23 (mg/dL)   Creatinine, Ser 0.45 (*) 0.50 - 1.10 (mg/dL)   Calcium 9.9  8.4 - 40.9 (mg/dL)   Total Protein 8.3  6.0 - 8.3 (g/dL)   Albumin 3.6  3.5 - 5.2 (g/dL)   AST 11  0 - 37 (U/L)   ALT <5  0 - 35 (U/L)   Alkaline Phosphatase 132 (*) 39 - 117 (U/L)   Total Bilirubin 0.3  0.3 - 1.2 (mg/dL)   GFR calc non Af Amer 32 (*) >90 (mL/min)   GFR calc Af Amer 37 (*) >90 (mL/min)  URINALYSIS, ROUTINE W REFLEX MICROSCOPIC      Component Value Range   Color, Urine YELLOW  YELLOW    APPearance CLEAR  CLEAR    Specific Gravity, Urine 1.022  1.005 - 1.030    pH 5.5  5.0 - 8.0    Glucose, UA NEGATIVE  NEGATIVE (mg/dL)   Hgb urine dipstick NEGATIVE  NEGATIVE    Bilirubin Urine SMALL (*) NEGATIVE    Ketones, ur TRACE (*) NEGATIVE (mg/dL)   Protein, ur NEGATIVE  NEGATIVE (mg/dL)   Urobilinogen, UA 0.2  0.0 - 1.0 (mg/dL)    Nitrite NEGATIVE  NEGATIVE    Leukocytes, UA NEGATIVE  NEGATIVE   URINE CULTURE      Component Value Range   Specimen Description URINE, CATHETERIZED     Special Requests NONE     Setup Time [id number]     Colony Count NO GROWTH     Culture NO GROWTH     Report Status 04/18/2011 FINAL    POCT I-STAT TROPONIN I      Component Value Range   Troponin i, poc 0.00  0.00 - 0.08 (ng/mL)   Comment 3           TSH      Component Value Range   TSH 0.310 (*) 0.350 - 4.500 (uIU/mL)  VITAMIN B12      Component Value Range   Vitamin B-12 890  211 - 911 (pg/mL)  PRO B NATRIURETIC PEPTIDE      Component Value Range   Pro B Natriuretic peptide (BNP) 535.2 (*) 0 - 450 (pg/mL)  CBC      Component Value Range   WBC 9.8  4.0 - 10.5 (K/uL)   RBC 3.33 (*) 3.87 - 5.11 (MIL/uL)   Hemoglobin 8.6 (*) 12.0 - 15.0 (g/dL)   HCT 21.3 (*) 08.6 - 46.0 (%)   MCV 84.4  78.0 - 100.0 (fL)   MCH 25.8 (*) 26.0 - 34.0 (pg)   MCHC 30.6  30.0 - 36.0 (g/dL)   RDW 57.8 (*) 46.9 -  15.5 (%)   Platelets 359  150 - 400 (K/uL)  COMPREHENSIVE METABOLIC PANEL      Component Value Range   Sodium 137  135 - 145 (mEq/L)   Potassium 4.2  3.5 - 5.1 (mEq/L)   Chloride 104  96 - 112 (mEq/L)   CO2 23  19 - 32 (mEq/L)   Glucose, Bld 95  70 - 99 (mg/dL)   BUN 31 (*) 6 - 23 (mg/dL)   Creatinine, Ser 8.11 (*) 0.50 - 1.10 (mg/dL)   Calcium 9.6  8.4 - 91.4 (mg/dL)   Total Protein 7.0  6.0 - 8.3 (g/dL)   Albumin 3.0 (*) 3.5 - 5.2 (g/dL)   AST 10  0 - 37 (U/L)   ALT <5  0 - 35 (U/L)   Alkaline Phosphatase 113  39 - 117 (U/L)   Total Bilirubin 0.3  0.3 - 1.2 (mg/dL)   GFR calc non Af Amer 34 (*) >90 (mL/min)   GFR calc Af Amer 40 (*) >90 (mL/min)  PROTIME-INR      Component Value Range   Prothrombin Time 14.1  11.6 - 15.2 (seconds)   INR 1.07  0.00 - 1.49   RPR      Component Value Range   RPR NON REACTIVE  NON REACTIVE   IRON AND TIBC      Component Value Range   Iron 13 (*) 42 - 135 (ug/dL)   TIBC 782  956 - 213  (ug/dL)   Saturation Ratios 4 (*) 20 - 55 (%)   UIBC 303  125 - 400 (ug/dL)  CBC      Component Value Range   WBC 8.7  4.0 - 10.5 (K/uL)   RBC 3.42 (*) 3.87 - 5.11 (MIL/uL)   Hemoglobin 8.7 (*) 12.0 - 15.0 (g/dL)   HCT 08.6 (*) 57.8 - 46.0 (%)   MCV 84.2  78.0 - 100.0 (fL)   MCH 25.4 (*) 26.0 - 34.0 (pg)   MCHC 30.2  30.0 - 36.0 (g/dL)   RDW 46.9 (*) 62.9 - 15.5 (%)   Platelets 373  150 - 400 (K/uL)  DIFFERENTIAL      Component Value Range   Neutrophils Relative 64  43 - 77 (%)   Neutro Abs 5.5  1.7 - 7.7 (K/uL)   Lymphocytes Relative 20  12 - 46 (%)   Lymphs Abs 1.7  0.7 - 4.0 (K/uL)   Monocytes Relative 14 (*) 3 - 12 (%)   Monocytes Absolute 1.2 (*) 0.1 - 1.0 (K/uL)   Eosinophils Relative 3  0 - 5 (%)   Eosinophils Absolute 0.2  0.0 - 0.7 (K/uL)   Basophils Relative 1  0 - 1 (%)   Basophils Absolute 0.0  0.0 - 0.1 (K/uL)  BASIC METABOLIC PANEL      Component Value Range   Sodium 138  135 - 145 (mEq/L)   Potassium 4.4  3.5 - 5.1 (mEq/L)   Chloride 104  96 - 112 (mEq/L)   CO2 26  19 - 32 (mEq/L)   Glucose, Bld 90  70 - 99 (mg/dL)   BUN 28 (*) 6 - 23 (mg/dL)   Creatinine, Ser 5.28 (*) 0.50 - 1.10 (mg/dL)   Calcium 9.8  8.4 - 41.3 (mg/dL)   GFR calc non Af Amer 38 (*) >90 (mL/min)   GFR calc Af Amer 44 (*) >90 (mL/min)  PRO B NATRIURETIC PEPTIDE      Component Value Range   Pro B Natriuretic peptide (  BNP) 756.9 (*) 0 - 450 (pg/mL)  SEDIMENTATION RATE      Component Value Range   Sed Rate 62 (*) 0 - 22 (mm/hr)     Signed: Ardian Haberland NEVILL 04/20/2011, 6:11 AM  7

## 2011-04-21 LAB — PROTEIN ELECTROPHORESIS, SERUM
Alpha-1-Globulin: 7.3 % — ABNORMAL HIGH (ref 2.9–4.9)
Alpha-2-Globulin: 14.1 % — ABNORMAL HIGH (ref 7.1–11.8)
Beta 2: 6.9 % — ABNORMAL HIGH (ref 3.2–6.5)
Beta 2: 7 % — ABNORMAL HIGH (ref 3.2–6.5)
Beta Globulin: 5.7 % (ref 4.7–7.2)
Gamma Globulin: 19.2 % — ABNORMAL HIGH (ref 11.1–18.8)
Gamma Globulin: 19.2 % — ABNORMAL HIGH (ref 11.1–18.8)
M-Spike, %: NOT DETECTED g/dL

## 2011-04-24 DIAGNOSIS — Z950 Presence of cardiac pacemaker: Secondary | ICD-10-CM | POA: Diagnosis not present

## 2011-07-17 ENCOUNTER — Encounter (HOSPITAL_COMMUNITY): Payer: Self-pay | Admitting: *Deleted

## 2011-07-17 ENCOUNTER — Emergency Department (HOSPITAL_COMMUNITY)
Admission: EM | Admit: 2011-07-17 | Discharge: 2011-07-17 | Disposition: A | Discharge status: Home / Self Care | Payer: Medicare Other | Attending: Emergency Medicine | Admitting: Emergency Medicine

## 2011-07-17 ENCOUNTER — Telehealth: Payer: Self-pay

## 2011-07-17 DIAGNOSIS — M549 Dorsalgia, unspecified: Secondary | ICD-10-CM | POA: Diagnosis not present

## 2011-07-17 DIAGNOSIS — R109 Unspecified abdominal pain: Secondary | ICD-10-CM | POA: Insufficient documentation

## 2011-07-17 DIAGNOSIS — J449 Chronic obstructive pulmonary disease, unspecified: Secondary | ICD-10-CM | POA: Insufficient documentation

## 2011-07-17 DIAGNOSIS — K551 Chronic vascular disorders of intestine: Secondary | ICD-10-CM | POA: Insufficient documentation

## 2011-07-17 DIAGNOSIS — I10 Essential (primary) hypertension: Secondary | ICD-10-CM | POA: Insufficient documentation

## 2011-07-17 DIAGNOSIS — J4489 Other specified chronic obstructive pulmonary disease: Secondary | ICD-10-CM | POA: Insufficient documentation

## 2011-07-17 LAB — CBC
HCT: 28.4 % — ABNORMAL LOW (ref 36.0–46.0)
Hemoglobin: 8.9 g/dL — ABNORMAL LOW (ref 12.0–15.0)
MCH: 26.5 pg (ref 26.0–34.0)
MCHC: 31.3 g/dL (ref 30.0–36.0)
RBC: 3.36 MIL/uL — ABNORMAL LOW (ref 3.87–5.11)

## 2011-07-17 LAB — BASIC METABOLIC PANEL
BUN: 17 mg/dL (ref 6–23)
Chloride: 106 mEq/L (ref 96–112)
GFR calc Af Amer: 45 mL/min — ABNORMAL LOW (ref >90)
Glucose, Bld: 85 mg/dL (ref 70–99)
Potassium: 4.6 mEq/L (ref 3.5–5.1)

## 2011-07-17 LAB — URINALYSIS, ROUTINE W REFLEX MICROSCOPIC
Bilirubin Urine: NEGATIVE
Hgb urine dipstick: NEGATIVE
Specific Gravity, Urine: 1.006 (ref 1.005–1.030)
pH: 6 (ref 5.0–8.0)

## 2011-07-17 LAB — URINE MICROSCOPIC-ADD ON

## 2011-07-17 MED ORDER — OXYCODONE-ACETAMINOPHEN 5-325 MG PO TABS: 2.0000 | ORAL_TABLET | ORAL | Status: DC | PRN

## 2011-07-17 MED ORDER — SODIUM CHLORIDE 0.9 % IV SOLN: INTRAVENOUS | Status: DC

## 2011-07-17 MED ORDER — MORPHINE SULFATE 4 MG/ML IJ SOLN: 4.0000 mg | Freq: Once | INTRAMUSCULAR | Status: DC

## 2011-07-17 MED ORDER — OXYCODONE-ACETAMINOPHEN 5-325 MG PO TABS: 2.0000 | ORAL_TABLET | ORAL | Status: AC | PRN

## 2011-07-17 MED ORDER — OXYCODONE-ACETAMINOPHEN 5-325 MG PO TABS
1.0000 | ORAL_TABLET | Freq: Once | ORAL | Status: AC
  Administered 2011-07-17: 1 via ORAL
  Filled 2011-07-17: qty 1

## 2011-07-17 MED ORDER — ONDANSETRON HCL 4 MG/2ML IJ SOLN: 4.0000 mg | Freq: Once | INTRAMUSCULAR | Status: DC

## 2011-07-17 NOTE — ED Notes (Addendum)
Right kidney present but isn't functioning d/t ischemic issues in June 2012 during an acute illness. Left kidney fully functioning. Decreased urinary output. incontinence at times. Typically followed by Dr. Imogene Burn with [facility]. Spoke with office today. MD felt as if pt's current complaints have anything to do with stents and suggested that she come to the ED for further evaluation.

## 2011-07-17 NOTE — ED Provider Notes (Signed)
History     CSN: [id number]  Arrival date & time 07/17/11  1403   First MD Initiated Contact with Patient 07/17/11 1524      Chief Complaint  Patient presents with  . Flank Pain    (Consider location/radiation/quality/duration/timing/severity/associated sxs/prior treatment) Patient is a 78 y.o. female presenting with flank pain. The history is provided by the patient and a relative.  Flank Pain Pertinent negatives include no chest pain, no abdominal pain, no headaches and no shortness of breath.   the patient is an 78 year old, female, with severe peripheral vascular disease.  She smoked for 65 years until last year.  She has a history of a stent in her.  Mesenteric artery.  Her right kidney is no longer functioning, and she has renal insufficiency.  She presents to the emergency department complaining of back pain.  For the past 2 days.  Today.  He was so severe that she could hardly stand up.  She took 2 Aleve and now.  Her pain has resolved.  She denied cough, or shortness of breath.  She denied nausea, vomiting, diarrhea, [place] or dysuria.  She has not fallen.  Past Medical History  Diagnosis Date  . Mesenteric ischemia, chronic   . Chronic total occlusion of artery of the extremities   . Tachy-brady syndrome     pacemaker-placed about 6 yrs ago.  . Anemia   . Hypertension   . Paroxysmal supraventricular tachycardia   . SBO (small bowel obstruction)     history partial  . Diverticulosis   . COPD (chronic obstructive pulmonary disease)     Past Surgical History  Procedure Date  . Appendectomy   . Abdominal hysterectomy   . Pacemaker insertion   . Iliac artery stent 07/30/10    Left CIA with angiogram    No family history on file.  History  Substance Use Topics  . Smoking status: Former Smoker -- 1.0 packs/day for 65 years    Types: Cigarettes    Quit date: 06/22/2010  . Smokeless tobacco: Not on file  . Alcohol Use: No    OB History    Grav Para Term  Preterm Abortions TAB SAB Ect Mult Living                  Review of Systems  Constitutional: Negative for fever and chills.  Respiratory: Negative for cough and shortness of breath.   Cardiovascular: Negative for chest pain, palpitations and leg swelling.  Gastrointestinal: Negative for nausea, vomiting, abdominal pain and diarrhea.  Genitourinary: Positive for flank pain. Negative for dysuria and hematuria.  Musculoskeletal: Positive for back pain.       Back pain is resolved now  Skin: Negative for rash.  Neurological: Negative for dizziness, light-headedness and headaches.  Psychiatric/Behavioral: Negative for confusion.  All other systems reviewed and are negative.    Allergies  Review of patient's allergies indicates no known allergies.  Home Medications   Current Outpatient Rx  Name Route Sig Dispense Refill  . ATENOLOL 50 MG PO TABS Oral Take 50 mg by mouth daily.      [name] Kitchen CLOPIDOGREL BISULFATE 75 MG PO TABS Oral Take 75 mg by mouth daily.      [name] Kitchen LOSARTAN POTASSIUM 100 MG PO TABS Oral Take 100 mg by mouth daily.       BP 188/64  Pulse 79  Temp(Src) 98.5 F (36.9 C) (Oral)  Resp 18  SpO2 99%  Physical Exam  Vitals reviewed. Constitutional: She is  oriented to person, place, and time. She appears well-developed and well-nourished.  HENT:  Head: Normocephalic and atraumatic.  Eyes: Conjunctivae are normal.  Neck: Normal range of motion. Neck supple.       Left carotid bruit  Cardiovascular: Normal rate.   No murmur heard. Pulmonary/Chest: Effort normal and breath sounds normal.  Abdominal: Soft. She exhibits no distension. There is no tenderness. There is no rebound and no guarding.       [place] No abdominal bruits  Musculoskeletal: Normal range of motion.       No thoracic or lumbar tenderness  Neurological: She is alert and oriented to person, place, and time.  Skin: Skin is warm and dry.  Psychiatric: She has a normal mood and affect. Thought content  normal.    ED Course  Procedures (including critical care time) 78 year old, female, with severe vascular disease, presents with back pain.  That was so severe she could hardly stand.  After taking 2 Aleve her symptoms are resolved.  She denies systemic illness or fall.  We will perform a urinalysis, and laboratory testing, however, since she is pain-free now.  There is no indication for medical treatment  Labs Reviewed  CBC - Abnormal; Notable for the following:    RBC 3.36 (*)    Hemoglobin 8.9 (*)    HCT 28.4 (*)    RDW 16.7 (*)    All other components within normal limits  URINALYSIS, ROUTINE W REFLEX MICROSCOPIC  BASIC METABOLIC PANEL   No results found.   No diagnosis found.  Pain controlled now  MDM  Back pain, resolved No signs neuro deficit.  Doubt uti/pyelo since few bact.  However, since + wbcs will cx urine.         Cheri Guppy, MD 07/17/11 [phone]

## 2011-07-17 NOTE — ED Notes (Signed)
Urine culture requisition printed and sent to lab for an add-on

## 2011-07-17 NOTE — Telephone Encounter (Signed)
Daughter called to report pt's c/o left back pain for past 6-7 days, and states it is located in the "kidney area".   States today her pain is worse than it has been.  Denies abdominal pain, nausea or vomiting.  States "has had chills about 4 days, but then, my mother is always cold".  Also states BP has been elevated: 189/99 and 187/72 reported.  States her left leg hurts all the time.  Also states "the left back pain has been present off and on since last August, but this is the worst I have seen her with it."  Discussed w/ Dr. Imogene Burn.  Recommends pt. either go to ER if pain is severe enough, or see PCP, to determine cause of back pain.  Discussed w/ daughter.  States she will call pt's PCP, initially.

## 2011-07-17 NOTE — Discharge Instructions (Signed)
Your blood tests do not show any significant illness.  Your urine shows some signs of infection, but there is no bacteria, so we will culture your urine rather than start antibiotics immediately.  We will contact you if you do have a urinary tract infection that needs to be treated with antibiotics Use Percocet for severe pain.  Followup with your Dr. if your symptoms persist.  Return for worse or uncontrolled symptoms

## 2011-07-17 NOTE — ED Notes (Signed)
Pt with cardiac hx here with back pain,  "worst its ever been"/  Pt has been seen for this in the past but it is worse today.  Pt denies any sob with this back pain, no nausea or vomiting with this either.

## 2011-07-18 LAB — URINE CULTURE: Culture  Setup Time: 201304261817

## 2011-07-28 DIAGNOSIS — I10 Essential (primary) hypertension: Secondary | ICD-10-CM | POA: Diagnosis not present

## 2011-07-28 DIAGNOSIS — I4891 Unspecified atrial fibrillation: Secondary | ICD-10-CM | POA: Diagnosis not present

## 2011-07-28 DIAGNOSIS — J449 Chronic obstructive pulmonary disease, unspecified: Secondary | ICD-10-CM | POA: Diagnosis not present

## 2011-07-28 DIAGNOSIS — I701 Atherosclerosis of renal artery: Secondary | ICD-10-CM | POA: Diagnosis not present

## 2011-07-28 DIAGNOSIS — Z950 Presence of cardiac pacemaker: Secondary | ICD-10-CM | POA: Diagnosis not present

## 2011-08-24 DIAGNOSIS — Z950 Presence of cardiac pacemaker: Secondary | ICD-10-CM | POA: Diagnosis not present

## 2011-09-15 DIAGNOSIS — Z950 Presence of cardiac pacemaker: Secondary | ICD-10-CM | POA: Diagnosis not present

## 2011-09-15 DIAGNOSIS — I10 Essential (primary) hypertension: Secondary | ICD-10-CM | POA: Diagnosis not present

## 2011-09-15 DIAGNOSIS — J449 Chronic obstructive pulmonary disease, unspecified: Secondary | ICD-10-CM | POA: Diagnosis not present

## 2011-09-15 DIAGNOSIS — I4891 Unspecified atrial fibrillation: Secondary | ICD-10-CM | POA: Diagnosis not present

## 2011-09-15 DIAGNOSIS — I701 Atherosclerosis of renal artery: Secondary | ICD-10-CM | POA: Diagnosis not present

## 2011-09-24 ENCOUNTER — Other Ambulatory Visit: Payer: Self-pay | Admitting: Thoracic Diseases

## 2011-10-09 ENCOUNTER — Other Ambulatory Visit: Payer: Medicare Other

## 2011-10-09 ENCOUNTER — Ambulatory Visit: Payer: Medicare Other | Admitting: Neurosurgery

## 2011-10-28 ENCOUNTER — Encounter: Payer: Self-pay | Admitting: Neurosurgery

## 2011-10-29 ENCOUNTER — Encounter: Payer: Self-pay | Admitting: Vascular Surgery

## 2011-10-29 ENCOUNTER — Ambulatory Visit: Payer: Medicare Other | Admitting: Neurosurgery

## 2011-10-29 ENCOUNTER — Other Ambulatory Visit (INDEPENDENT_AMBULATORY_CARE_PROVIDER_SITE_OTHER): Payer: Medicare Other | Admitting: *Deleted

## 2011-10-29 ENCOUNTER — Other Ambulatory Visit: Payer: Medicare Other

## 2011-10-29 ENCOUNTER — Encounter (INDEPENDENT_AMBULATORY_CARE_PROVIDER_SITE_OTHER): Payer: Medicare Other | Admitting: *Deleted

## 2011-10-29 DIAGNOSIS — Z48812 Encounter for surgical aftercare following surgery on the circulatory system: Secondary | ICD-10-CM | POA: Diagnosis not present

## 2011-10-29 DIAGNOSIS — I70219 Atherosclerosis of native arteries of extremities with intermittent claudication, unspecified extremity: Secondary | ICD-10-CM

## 2011-10-29 DIAGNOSIS — I701 Atherosclerosis of renal artery: Secondary | ICD-10-CM

## 2011-10-29 DIAGNOSIS — I739 Peripheral vascular disease, unspecified: Secondary | ICD-10-CM

## 2011-10-30 ENCOUNTER — Other Ambulatory Visit: Payer: Self-pay | Admitting: Vascular Surgery

## 2011-10-30 ENCOUNTER — Encounter: Payer: Self-pay | Admitting: Vascular Surgery

## 2011-10-30 ENCOUNTER — Ambulatory Visit (INDEPENDENT_AMBULATORY_CARE_PROVIDER_SITE_OTHER): Payer: Medicare Other | Admitting: Vascular Surgery

## 2011-10-30 VITALS — BP 135/81 | HR 86 | Resp 18 | Ht 62.0 in | Wt 111.0 lb

## 2011-10-30 DIAGNOSIS — K551 Chronic vascular disorders of intestine: Secondary | ICD-10-CM

## 2011-10-30 DIAGNOSIS — I743 Embolism and thrombosis of arteries of the lower extremities: Secondary | ICD-10-CM | POA: Diagnosis not present

## 2011-10-30 DIAGNOSIS — I701 Atherosclerosis of renal artery: Secondary | ICD-10-CM

## 2011-10-30 DIAGNOSIS — I779 Disorder of arteries and arterioles, unspecified: Secondary | ICD-10-CM

## 2011-10-30 DIAGNOSIS — N189 Chronic kidney disease, unspecified: Secondary | ICD-10-CM | POA: Diagnosis not present

## 2011-10-30 LAB — BASIC METABOLIC PANEL
BUN: 17 mg/dL (ref 6–23)
CO2: 23 mEq/L (ref 19–32)
Calcium: 9.5 mg/dL (ref 8.4–10.5)
Creat: 1.17 mg/dL — ABNORMAL HIGH (ref 0.50–1.10)

## 2011-10-30 NOTE — Progress Notes (Signed)
[facility]   Established Chronic Mesenteric Ischemia, Intermittent Claudication, and Renal Artery Stenosis   History of Present Illness  [name] [name] is a 78 y.o. female who presents with chief complaint: routine follow-up. Patient is s/p L CIA stenting, and L RA stenting. She denies any significant abdominal pain.  She occasional has lower back pain located to the lower spine.  She denies food fear or pain with eating. She apparently snacks throughout the day.  Her persistent numbness in her left foot continues . She is able to ambulate enough to complete her ADL. She denies any foot wounds, ulcer, or drainage.   Her HTN continues to be fluctate and her regimen is being titrated by her cardiologist.  She denies any changes in her urinary habits.   Patient PMH, PSH, SH, FamHx, Medications, Allergies, and ROS are unchanged from previous visit on 04/03/11. ROS as noted as above, otherwise negative.   Physical Examination  Filed Vitals:   10/30/11 1152  BP: 135/81  Pulse: 86  Resp: 18  Height: 5\' 2"  (1.575 m)  Weight: 111 lb (50.349 kg)   General: A&O x 3, WDWN, elderly and cachectic   Pulmonary: Sym exp, good air movt, CTAB, no rales, rhonchi, & wheezing   Cardiac: RRR, Nl S1, S2, no Murmurs, rubs or gallops   Vascular:  Vessel  Right  Left   Radial  Palpable  Palpable   Brachial  Palpable  Palpable   Carotid  Palpable, without bruit  Palpable, without bruit   Aorta  Non-palpable  N/A   Femoral  Weakly Palpable  Strongly Palpable   Popliteal  Non-palpable  Non-palpable   PT  Non-Palpable  Non-Palpable   DP  Non-Palpable  Non-Palpable    Gastrointestinal: soft, NTND, -G/R, - HSM, - masses, - CVAT B   Musculoskeletal: M/S 5/5 throughout , Extremities without ischemic changes , cyanotic changes in both feet related to CVI  Neurologic: Pain and light touch intact in extremities , Motor exam as listed above   Non-Invasive Vascular Imaging  ABI  (Date: 10/30/11)  RLE: 0.82, PT & DP: biphasic  LLE: 0.46, Pt & DP: monophasic  L Renovascular Duplex (Date: 10/29/11)  RAR 9.5  Kidney 10.6 cm  Renal: 632-801 c/s  AORTOILIAC Duplex (Date: 10/29/11)  Widely patent L CIA  Ao 79. C/s  PSV in-stent: 215 c/s (2.7)   Medical Decision Making  [name] [name] is a 78 y.o. female who presents with: stable asx chronic mesenteric ischemia, BLE PAD, and B RAS.  Based on her exam and studies, I have offered the patient surveillance studies including: BLE ABI, Aortoiliac duplex, Mesenteric duplex, and B renal artery duplex in the next 3 months.  Given the difficulty of the renal duplex, I don't think I would immediately repeat the L renal angiogram based on just high velocities, as these velocities may be related to the individual operator.  The kidney size also has not changed that much.  I am ordering a BMP to check her Cr at this point. As we discussed previously, we will avoid any significant operations in this patient due to her cardiac risks > benefit.  She will continue with her Plavix use and we discussed previously.  I discussed in depth with the patient the nature of atherosclerosis, and emphasized the importance of maximal medical management including strict control of blood pressure, blood glucose, and lipid levels, obtaining regular exercise, and cessation of smoking. The patient is aware that without  maximal medical management the underlying atherosclerotic disease process will progress, limiting the benefit of any interventions.  I emphasize the importance of routinely taking her medications to avoid acute complications and also to avoid long-term sequelae of chronic disease.  Thank you for allowing [place] to participate in this patient's care.   Leonides Sake, MD  [facility] [place]  Office: [phone]  Pager: [phone]

## 2011-11-09 ENCOUNTER — Telehealth: Payer: Self-pay | Admitting: *Deleted

## 2011-11-09 NOTE — Telephone Encounter (Signed)
Called Mrs. Carlson's daughter back re: 10-30-11 bloodwork per Dr. Nicky Pugh office note on that same date. Creatinine level was 1.17, blood glucose was low at 69, this was drawn at 2:18pm after the patient had eaten earlier. I advised her daughter Alvira Monday) to call Dr. Kevan Ny office for follow up. Ms. Achilles Dunk voiced understanding of this plan.

## 2011-11-24 DIAGNOSIS — D509 Iron deficiency anemia, unspecified: Secondary | ICD-10-CM | POA: Diagnosis not present

## 2011-12-09 DIAGNOSIS — E559 Vitamin D deficiency, unspecified: Secondary | ICD-10-CM | POA: Diagnosis not present

## 2011-12-09 DIAGNOSIS — I4891 Unspecified atrial fibrillation: Secondary | ICD-10-CM | POA: Diagnosis not present

## 2011-12-09 DIAGNOSIS — Z23 Encounter for immunization: Secondary | ICD-10-CM | POA: Diagnosis not present

## 2011-12-09 DIAGNOSIS — I10 Essential (primary) hypertension: Secondary | ICD-10-CM | POA: Diagnosis not present

## 2011-12-09 DIAGNOSIS — D509 Iron deficiency anemia, unspecified: Secondary | ICD-10-CM | POA: Diagnosis not present

## 2011-12-09 DIAGNOSIS — I701 Atherosclerosis of renal artery: Secondary | ICD-10-CM | POA: Diagnosis not present

## 2011-12-09 DIAGNOSIS — J449 Chronic obstructive pulmonary disease, unspecified: Secondary | ICD-10-CM | POA: Diagnosis not present

## 2011-12-09 DIAGNOSIS — Z1331 Encounter for screening for depression: Secondary | ICD-10-CM | POA: Diagnosis not present

## 2011-12-21 DIAGNOSIS — M19049 Primary osteoarthritis, unspecified hand: Secondary | ICD-10-CM | POA: Diagnosis not present

## 2011-12-22 ENCOUNTER — Telehealth: Payer: Self-pay

## 2011-12-22 DIAGNOSIS — Z950 Presence of cardiac pacemaker: Secondary | ICD-10-CM | POA: Diagnosis not present

## 2011-12-22 NOTE — Telephone Encounter (Signed)
Daughter called to report pt. Has had episodes of back pain that usually last approx. 3-4 days, and now this time she has had backache for 8 days.  States pt. C/o pain in left lower back.  States it has been temporarily relieved with Percocet.  Reports fever the past 24-48 hrs as high as 101 degrees.  States she is concerned pt. is dehydrated.  States her BP has been more elevated; reports recent BP  Of 150/105.  Denies nausea or vomiting.  Questioned if symptoms have been reported to her medical doctor?  Stated "no, Dr. Imogene Burn usually takes care of her kidneys".  Discussed w/ Dr. Arbie Cookey.  Recommends pt. Report symptoms to her PCP.  Advised daughter of this, and also advised that if pain is severe, pt. Should go to Urgent care or the ER.  Verb. Understanding.

## 2011-12-23 DIAGNOSIS — M19049 Primary osteoarthritis, unspecified hand: Secondary | ICD-10-CM | POA: Diagnosis not present

## 2011-12-23 DIAGNOSIS — R109 Unspecified abdominal pain: Secondary | ICD-10-CM | POA: Diagnosis not present

## 2011-12-24 DIAGNOSIS — M19049 Primary osteoarthritis, unspecified hand: Secondary | ICD-10-CM | POA: Diagnosis not present

## 2011-12-29 DIAGNOSIS — M19049 Primary osteoarthritis, unspecified hand: Secondary | ICD-10-CM | POA: Diagnosis not present

## 2012-01-05 DIAGNOSIS — M6530 Trigger finger, unspecified finger: Secondary | ICD-10-CM | POA: Diagnosis not present

## 2012-01-07 DIAGNOSIS — M6530 Trigger finger, unspecified finger: Secondary | ICD-10-CM | POA: Diagnosis not present

## 2012-01-11 DIAGNOSIS — M6530 Trigger finger, unspecified finger: Secondary | ICD-10-CM | POA: Diagnosis not present

## 2012-01-25 DIAGNOSIS — M6530 Trigger finger, unspecified finger: Secondary | ICD-10-CM | POA: Diagnosis not present

## 2012-01-25 DIAGNOSIS — M19049 Primary osteoarthritis, unspecified hand: Secondary | ICD-10-CM | POA: Diagnosis not present

## 2012-01-29 ENCOUNTER — Ambulatory Visit: Payer: Medicare Other | Admitting: Vascular Surgery

## 2012-01-29 ENCOUNTER — Other Ambulatory Visit: Payer: Medicare Other

## 2012-02-05 ENCOUNTER — Other Ambulatory Visit: Payer: Medicare Other

## 2012-02-05 ENCOUNTER — Encounter (INDEPENDENT_AMBULATORY_CARE_PROVIDER_SITE_OTHER): Payer: Medicare Other

## 2012-02-05 ENCOUNTER — Other Ambulatory Visit (INDEPENDENT_AMBULATORY_CARE_PROVIDER_SITE_OTHER): Payer: Medicare Other

## 2012-02-05 ENCOUNTER — Ambulatory Visit: Payer: Medicare Other | Admitting: Vascular Surgery

## 2012-02-05 DIAGNOSIS — I70219 Atherosclerosis of native arteries of extremities with intermittent claudication, unspecified extremity: Secondary | ICD-10-CM | POA: Diagnosis not present

## 2012-02-05 DIAGNOSIS — I701 Atherosclerosis of renal artery: Secondary | ICD-10-CM

## 2012-02-05 DIAGNOSIS — I779 Disorder of arteries and arterioles, unspecified: Secondary | ICD-10-CM

## 2012-02-05 DIAGNOSIS — I739 Peripheral vascular disease, unspecified: Secondary | ICD-10-CM | POA: Diagnosis not present

## 2012-02-11 ENCOUNTER — Encounter: Payer: Self-pay | Admitting: Vascular Surgery

## 2012-02-12 ENCOUNTER — Ambulatory Visit: Payer: Medicare Other | Admitting: Vascular Surgery

## 2012-03-04 DIAGNOSIS — M4000 Postural kyphosis, site unspecified: Secondary | ICD-10-CM | POA: Diagnosis not present

## 2012-03-04 DIAGNOSIS — M546 Pain in thoracic spine: Secondary | ICD-10-CM | POA: Diagnosis not present

## 2012-03-04 DIAGNOSIS — E559 Vitamin D deficiency, unspecified: Secondary | ICD-10-CM | POA: Diagnosis not present

## 2012-03-04 DIAGNOSIS — E538 Deficiency of other specified B group vitamins: Secondary | ICD-10-CM | POA: Diagnosis not present

## 2012-03-04 DIAGNOSIS — K5901 Slow transit constipation: Secondary | ICD-10-CM | POA: Diagnosis not present

## 2012-03-04 DIAGNOSIS — D509 Iron deficiency anemia, unspecified: Secondary | ICD-10-CM | POA: Diagnosis not present

## 2012-03-04 DIAGNOSIS — Z1331 Encounter for screening for depression: Secondary | ICD-10-CM | POA: Diagnosis not present

## 2012-03-11 DIAGNOSIS — M48061 Spinal stenosis, lumbar region without neurogenic claudication: Secondary | ICD-10-CM | POA: Diagnosis not present

## 2012-03-18 ENCOUNTER — Ambulatory Visit: Payer: Medicare Other | Admitting: Vascular Surgery

## 2012-03-18 ENCOUNTER — Other Ambulatory Visit: Payer: Medicare Other

## 2012-03-21 DIAGNOSIS — J449 Chronic obstructive pulmonary disease, unspecified: Secondary | ICD-10-CM | POA: Diagnosis not present

## 2012-04-26 DIAGNOSIS — Z950 Presence of cardiac pacemaker: Secondary | ICD-10-CM | POA: Diagnosis not present

## 2012-05-12 DIAGNOSIS — G500 Trigeminal neuralgia: Secondary | ICD-10-CM | POA: Diagnosis not present

## 2012-05-12 DIAGNOSIS — H9209 Otalgia, unspecified ear: Secondary | ICD-10-CM | POA: Diagnosis not present

## 2012-05-12 DIAGNOSIS — R51 Headache: Secondary | ICD-10-CM | POA: Diagnosis not present

## 2012-08-13 ENCOUNTER — Other Ambulatory Visit: Payer: Self-pay | Admitting: Surgery

## 2012-08-15 ENCOUNTER — Other Ambulatory Visit: Payer: Self-pay | Admitting: Surgery

## 2012-08-23 DIAGNOSIS — Z950 Presence of cardiac pacemaker: Secondary | ICD-10-CM | POA: Diagnosis not present

## 2012-08-23 DIAGNOSIS — I495 Sick sinus syndrome: Secondary | ICD-10-CM | POA: Diagnosis not present

## 2012-08-29 DIAGNOSIS — H6980 Other specified disorders of Eustachian tube, unspecified ear: Secondary | ICD-10-CM | POA: Diagnosis not present

## 2012-08-29 DIAGNOSIS — J310 Chronic rhinitis: Secondary | ICD-10-CM | POA: Diagnosis not present

## 2012-08-29 DIAGNOSIS — H9209 Otalgia, unspecified ear: Secondary | ICD-10-CM | POA: Diagnosis not present

## 2012-09-15 DIAGNOSIS — I5030 Unspecified diastolic (congestive) heart failure: Secondary | ICD-10-CM | POA: Diagnosis not present

## 2012-09-15 DIAGNOSIS — J449 Chronic obstructive pulmonary disease, unspecified: Secondary | ICD-10-CM | POA: Diagnosis not present

## 2012-09-15 DIAGNOSIS — Z950 Presence of cardiac pacemaker: Secondary | ICD-10-CM | POA: Diagnosis not present

## 2012-09-15 DIAGNOSIS — I10 Essential (primary) hypertension: Secondary | ICD-10-CM | POA: Diagnosis not present

## 2012-09-15 DIAGNOSIS — I4891 Unspecified atrial fibrillation: Secondary | ICD-10-CM | POA: Diagnosis not present

## 2012-09-16 DIAGNOSIS — Z79899 Other long term (current) drug therapy: Secondary | ICD-10-CM | POA: Diagnosis not present

## 2012-09-16 DIAGNOSIS — D509 Iron deficiency anemia, unspecified: Secondary | ICD-10-CM | POA: Diagnosis not present

## 2012-09-16 DIAGNOSIS — H5316 Psychophysical visual disturbances: Secondary | ICD-10-CM | POA: Diagnosis not present

## 2012-09-16 DIAGNOSIS — E559 Vitamin D deficiency, unspecified: Secondary | ICD-10-CM | POA: Diagnosis not present

## 2012-09-16 DIAGNOSIS — I10 Essential (primary) hypertension: Secondary | ICD-10-CM | POA: Diagnosis not present

## 2012-09-16 DIAGNOSIS — H9190 Unspecified hearing loss, unspecified ear: Secondary | ICD-10-CM | POA: Diagnosis not present

## 2012-09-16 DIAGNOSIS — I4891 Unspecified atrial fibrillation: Secondary | ICD-10-CM | POA: Diagnosis not present

## 2012-09-16 DIAGNOSIS — Z1331 Encounter for screening for depression: Secondary | ICD-10-CM | POA: Diagnosis not present

## 2012-09-16 DIAGNOSIS — Z0000 Encounter for general adult medical examination without abnormal findings: Secondary | ICD-10-CM | POA: Diagnosis not present

## 2012-09-16 DIAGNOSIS — E538 Deficiency of other specified B group vitamins: Secondary | ICD-10-CM | POA: Diagnosis not present

## 2012-10-23 ENCOUNTER — Other Ambulatory Visit: Payer: Self-pay | Admitting: Surgery

## 2012-11-02 ENCOUNTER — Other Ambulatory Visit: Payer: Medicare Other

## 2012-11-03 ENCOUNTER — Other Ambulatory Visit: Payer: Self-pay | Admitting: *Deleted

## 2012-11-03 DIAGNOSIS — Z48812 Encounter for surgical aftercare following surgery on the circulatory system: Secondary | ICD-10-CM

## 2012-11-03 DIAGNOSIS — I701 Atherosclerosis of renal artery: Secondary | ICD-10-CM

## 2012-11-03 DIAGNOSIS — I739 Peripheral vascular disease, unspecified: Secondary | ICD-10-CM

## 2012-11-04 ENCOUNTER — Ambulatory Visit: Payer: Medicare Other | Admitting: Vascular Surgery

## 2012-11-23 ENCOUNTER — Other Ambulatory Visit (INDEPENDENT_AMBULATORY_CARE_PROVIDER_SITE_OTHER): Payer: Medicare Other | Admitting: Vascular Surgery

## 2012-11-23 ENCOUNTER — Encounter (INDEPENDENT_AMBULATORY_CARE_PROVIDER_SITE_OTHER): Payer: Medicare Other | Admitting: Vascular Surgery

## 2012-11-23 DIAGNOSIS — K551 Chronic vascular disorders of intestine: Secondary | ICD-10-CM | POA: Diagnosis not present

## 2012-11-23 DIAGNOSIS — Z48812 Encounter for surgical aftercare following surgery on the circulatory system: Secondary | ICD-10-CM

## 2012-11-23 DIAGNOSIS — I739 Peripheral vascular disease, unspecified: Secondary | ICD-10-CM

## 2012-11-23 DIAGNOSIS — I701 Atherosclerosis of renal artery: Secondary | ICD-10-CM | POA: Diagnosis not present

## 2012-11-23 DIAGNOSIS — I77811 Abdominal aortic ectasia: Secondary | ICD-10-CM

## 2012-11-24 ENCOUNTER — Encounter: Payer: Self-pay | Admitting: Vascular Surgery

## 2012-11-25 ENCOUNTER — Ambulatory Visit (INDEPENDENT_AMBULATORY_CARE_PROVIDER_SITE_OTHER): Payer: Medicare Other | Admitting: Vascular Surgery

## 2012-11-25 ENCOUNTER — Encounter: Payer: Self-pay | Admitting: Vascular Surgery

## 2012-11-25 VITALS — BP 184/69 | HR 72 | Resp 16 | Ht 62.0 in | Wt 117.0 lb

## 2012-11-25 DIAGNOSIS — I701 Atherosclerosis of renal artery: Secondary | ICD-10-CM | POA: Diagnosis not present

## 2012-11-25 DIAGNOSIS — Z48812 Encounter for surgical aftercare following surgery on the circulatory system: Secondary | ICD-10-CM | POA: Diagnosis not present

## 2012-11-25 DIAGNOSIS — I739 Peripheral vascular disease, unspecified: Secondary | ICD-10-CM | POA: Diagnosis not present

## 2012-11-25 DIAGNOSIS — I70219 Atherosclerosis of native arteries of extremities with intermittent claudication, unspecified extremity: Secondary | ICD-10-CM | POA: Diagnosis not present

## 2012-11-25 DIAGNOSIS — K551 Chronic vascular disorders of intestine: Secondary | ICD-10-CM

## 2012-11-25 NOTE — Progress Notes (Signed)
[facility] [place]   Established Chronic Mesenteric Ischemia, Intermittent Claudication, and Renal Artery Stenosis  History of Present Illness  [name] [name] is 79 y.o. female who presents with chief complaint: routine follow-up. Patient is s/p L CIA stenting, and L RA stenting. She denies any significant abdominal pain. She has gained some weight.   She denies food fear or pain with eating. Her persistent numbness in her left foot continues . She is able to ambulate enough to complete her ADL. She has had some episodes of L leg weakness leading to falls.  She also notes occasional swelling in left ankle.  She denies any foot wounds, ulcer, or drainage. Her HTN continues to fluctate and her regimen is being titrated by her cardiologist. She denies any changes in her urinary habits.   Patient PMH, PSH, SH, FamHx, Medications, Allergies, and ROS are unchanged from previous visit on 10/30/11. ROS: no vertigo, no melena or hematochezia  Physical Examination  Filed Vitals:   11/25/12 0845  BP: 184/69  Pulse: 72  Resp: 16  Height: 5\' 2"  (1.575 m)  Weight: 117 lb (53.071 kg)  SpO2: 98%   Body mass index is 21.39 kg/(m^2).  General: A&O x 3, WDWN, elderly and cachectic   Pulmonary: Sym exp, good air movt, CTAB, no rales, rhonchi, & wheezing   Cardiac: RRR, Nl S1, S2, no Murmurs, rubs or gallops   Vascular:  Vessel  Right  Left   Radial  Palpable  Palpable   Brachial  Palpable  Palpable   Carotid  Palpable, without bruit  Palpable, without bruit   Aorta  Non-palpable  N/A   Femoral  Weakly Palpable  Strongly Palpable   Popliteal  Non-palpable  Non-palpable   PT  Non-Palpable  Non-Palpable   DP  Non-Palpable  Non-Palpable    Gastrointestinal: soft, NTND, -G/R, - HSM, - masses, - CVAT B   Musculoskeletal: M/S 5/5 throughout , Extremities without ischemic changes , cyanotic changes in both feet related to CVI   Neurologic: Pain and light touch intact in  extremities , Motor exam as listed above   Non-Invasive Vascular Imaging  ABI (Date: 11/25/2012)  R: 0.81 (0.82), DP: bi, PT: bi, TBI: ND  L: 0.48 (0.46), DP: mono, PT: mono, TBI: ND  L Renovascular Duplex (Date: 10/29/11)  Patent L RA stent Occluded R RA Occluded SMA and CA Patent IMA  AORTOILIAC Duplex (Date: 11/25/2012)  Patent L CIA  Ao 99. C/s  PSV in-stent: 188 c/s (<3.0)  Medical Decision Making  [name] [name] is 79 y.o. female who presents with: stable asx chronic mesenteric ischemia, BLE PAD with patent L CIA stent, and patent L RA stent with likely occluded R RA.  Based on her exam and studies, I have offered the patient surveillance studies including: BLE ABI, Aortoiliac duplex, Mesenteric duplex, and B renal artery duplex in the next 6 months.  As we discussed previously, we will avoid any significant operations in this patient due to her cardiac risks > benefit.  She will continue with her Plavix use and we discussed previously.  I discussed in depth with the patient the nature of atherosclerosis, and emphasized the importance of maximal medical management including strict control of blood pressure, blood glucose, and lipid levels, obtaining regular exercise, and cessation of smoking. The patient is aware that without maximal medical management the underlying atherosclerotic disease process will progress, limiting the benefit of any interventions.  I emphasize the importance of routinely taking her  medications to avoid acute complications and also to avoid long-term sequelae of chronic disease.  Thank you for allowing [name] [name] to participate in this patient's care.  Leonides Sake, MD [facility] [place] Office: [phone] Pager: [phone]  11/25/2012, 1:04 PM

## 2012-12-15 ENCOUNTER — Other Ambulatory Visit: Payer: Self-pay

## 2012-12-15 DIAGNOSIS — I739 Peripheral vascular disease, unspecified: Secondary | ICD-10-CM

## 2012-12-15 MED ORDER — CLOPIDOGREL BISULFATE 75 MG PO TABS: 75.0000 mg | ORAL_TABLET | Freq: Every day | ORAL | Status: DC

## 2012-12-23 ENCOUNTER — Encounter: Payer: Self-pay | Admitting: Interventional Cardiology

## 2013-01-02 DIAGNOSIS — J310 Chronic rhinitis: Secondary | ICD-10-CM | POA: Diagnosis not present

## 2013-01-02 DIAGNOSIS — G500 Trigeminal neuralgia: Secondary | ICD-10-CM | POA: Diagnosis not present

## 2013-01-02 DIAGNOSIS — I4891 Unspecified atrial fibrillation: Secondary | ICD-10-CM | POA: Diagnosis not present

## 2013-01-02 DIAGNOSIS — H9190 Unspecified hearing loss, unspecified ear: Secondary | ICD-10-CM | POA: Diagnosis not present

## 2013-01-02 DIAGNOSIS — M546 Pain in thoracic spine: Secondary | ICD-10-CM | POA: Diagnosis not present

## 2013-01-02 DIAGNOSIS — D509 Iron deficiency anemia, unspecified: Secondary | ICD-10-CM | POA: Diagnosis not present

## 2013-01-02 DIAGNOSIS — E559 Vitamin D deficiency, unspecified: Secondary | ICD-10-CM | POA: Diagnosis not present

## 2013-01-02 DIAGNOSIS — E538 Deficiency of other specified B group vitamins: Secondary | ICD-10-CM | POA: Diagnosis not present

## 2013-01-02 DIAGNOSIS — I10 Essential (primary) hypertension: Secondary | ICD-10-CM | POA: Diagnosis not present

## 2013-01-05 ENCOUNTER — Ambulatory Visit (INDEPENDENT_AMBULATORY_CARE_PROVIDER_SITE_OTHER): Payer: Medicare Other | Admitting: *Deleted

## 2013-01-05 DIAGNOSIS — I495 Sick sinus syndrome: Secondary | ICD-10-CM | POA: Diagnosis not present

## 2013-01-05 LAB — PACEMAKER DEVICE OBSERVATION
AL AMPLITUDE: 2.1991 mv
AL IMPEDENCE PM: 408 Ohm
AL THRESHOLD: 0.5 V
BATTERY VOLTAGE: 2.95 V
RV LEAD AMPLITUDE: 8.969 mv
RV LEAD IMPEDENCE PM: 456 Ohm

## 2013-01-05 NOTE — Progress Notes (Signed)
Pacemaker check in clinic. Normal device function. Thresholds, sensing, impedances consistent with previous measurements. Device programmed to maximize longevity. <0.1% A-fib, + plavix.  3 VT episodes all 1 second.  Device programmed at appropriate safety margins. Histogram distribution appropriate for patient activity level. Device programmed to optimize intrinsic conduction.  Patient education completed.  ROV with Dr. Ladona Ridgel in January.

## 2013-01-16 ENCOUNTER — Encounter: Payer: Self-pay | Admitting: Internal Medicine

## 2013-03-05 ENCOUNTER — Encounter (HOSPITAL_COMMUNITY): Payer: Self-pay | Admitting: Emergency Medicine

## 2013-03-05 ENCOUNTER — Emergency Department (HOSPITAL_COMMUNITY): Payer: Medicare Other

## 2013-03-05 ENCOUNTER — Emergency Department (HOSPITAL_COMMUNITY)
Admission: EM | Admit: 2013-03-05 | Discharge: 2013-03-05 | Disposition: A | Discharge status: Home / Self Care | Payer: Medicare Other | Attending: Emergency Medicine | Admitting: Emergency Medicine

## 2013-03-05 DIAGNOSIS — S20211A Contusion of right front wall of thorax, initial encounter: Secondary | ICD-10-CM

## 2013-03-05 DIAGNOSIS — Z8719 Personal history of other diseases of the digestive system: Secondary | ICD-10-CM | POA: Insufficient documentation

## 2013-03-05 DIAGNOSIS — Z7901 Long term (current) use of anticoagulants: Secondary | ICD-10-CM | POA: Diagnosis not present

## 2013-03-05 DIAGNOSIS — R079 Chest pain, unspecified: Secondary | ICD-10-CM | POA: Diagnosis not present

## 2013-03-05 DIAGNOSIS — D649 Anemia, unspecified: Secondary | ICD-10-CM | POA: Diagnosis not present

## 2013-03-05 DIAGNOSIS — S298XXA Other specified injuries of thorax, initial encounter: Secondary | ICD-10-CM | POA: Diagnosis not present

## 2013-03-05 DIAGNOSIS — J449 Chronic obstructive pulmonary disease, unspecified: Secondary | ICD-10-CM | POA: Insufficient documentation

## 2013-03-05 DIAGNOSIS — W1809XA Striking against other object with subsequent fall, initial encounter: Secondary | ICD-10-CM | POA: Insufficient documentation

## 2013-03-05 DIAGNOSIS — R0602 Shortness of breath: Secondary | ICD-10-CM | POA: Diagnosis not present

## 2013-03-05 DIAGNOSIS — I129 Hypertensive chronic kidney disease with stage 1 through stage 4 chronic kidney disease, or unspecified chronic kidney disease: Secondary | ICD-10-CM | POA: Diagnosis not present

## 2013-03-05 DIAGNOSIS — Z9861 Coronary angioplasty status: Secondary | ICD-10-CM | POA: Diagnosis not present

## 2013-03-05 DIAGNOSIS — S20219A Contusion of unspecified front wall of thorax, initial encounter: Secondary | ICD-10-CM | POA: Insufficient documentation

## 2013-03-05 DIAGNOSIS — J4489 Other specified chronic obstructive pulmonary disease: Secondary | ICD-10-CM | POA: Diagnosis not present

## 2013-03-05 DIAGNOSIS — Z79899 Other long term (current) drug therapy: Secondary | ICD-10-CM | POA: Insufficient documentation

## 2013-03-05 DIAGNOSIS — I7092 Chronic total occlusion of artery of the extremities: Secondary | ICD-10-CM | POA: Diagnosis not present

## 2013-03-05 DIAGNOSIS — Y92009 Unspecified place in unspecified non-institutional (private) residence as the place of occurrence of the external cause: Secondary | ICD-10-CM | POA: Insufficient documentation

## 2013-03-05 DIAGNOSIS — Z950 Presence of cardiac pacemaker: Secondary | ICD-10-CM | POA: Insufficient documentation

## 2013-03-05 DIAGNOSIS — K551 Chronic vascular disorders of intestine: Secondary | ICD-10-CM | POA: Diagnosis not present

## 2013-03-05 DIAGNOSIS — N289 Disorder of kidney and ureter, unspecified: Secondary | ICD-10-CM | POA: Diagnosis not present

## 2013-03-05 DIAGNOSIS — Y9389 Activity, other specified: Secondary | ICD-10-CM | POA: Insufficient documentation

## 2013-03-05 DIAGNOSIS — Z87891 Personal history of nicotine dependence: Secondary | ICD-10-CM | POA: Diagnosis not present

## 2013-03-05 DIAGNOSIS — H9190 Unspecified hearing loss, unspecified ear: Secondary | ICD-10-CM | POA: Insufficient documentation

## 2013-03-05 MED ORDER — DOCUSATE SODIUM 100 MG PO CAPS: 100.0000 mg | ORAL_CAPSULE | Freq: Two times a day (BID) | ORAL | Status: DC

## 2013-03-05 MED ORDER — HYDROCODONE-ACETAMINOPHEN 5-325 MG PO TABS: ORAL_TABLET | ORAL | Status: DC

## 2013-03-05 MED ORDER — HYDROCODONE-ACETAMINOPHEN 5-325 MG PO TABS
1.0000 | ORAL_TABLET | Freq: Once | ORAL | Status: AC
  Administered 2013-03-05: 1 via ORAL
  Filled 2013-03-05: qty 1

## 2013-03-05 NOTE — ED Notes (Signed)
Pt was putting some objects into a box on the floor last night and ankle gave out and pt fell to flood hitting her right rib cage on box. Pt c/o painful to breathe in and out deeply and has some bruising to bilat forearms and skin tear to right wrist area.

## 2013-03-05 NOTE — Discharge Instructions (Signed)
Blunt Chest Trauma Blunt chest trauma is an injury caused by a blow to the chest. These chest injuries can be very painful. Blunt chest trauma often results in bruised or broken (fractured) ribs. Most cases of bruised and fractured ribs from blunt chest traumas get better after 1 to 3 weeks of rest and pain medicine. Often, the soft tissue in the chest wall is also injured, causing pain and bruising. Internal organs, such as the heart and lungs, may also be injured. Blunt chest trauma can lead to serious medical problems. This injury requires immediate medical care. CAUSES   Motor vehicle collisions.  Falls.  Physical violence.  Sports injuries. SYMPTOMS   Chest pain. The pain may be worse when you move or breathe deeply.  Shortness of breath.  Lightheadedness.  Bruising.  Tenderness.  Swelling. DIAGNOSIS  Your caregiver will do a physical exam. X-rays may be taken to look for fractures. However, minor rib fractures may not show up on X-rays until a few days after the injury. If a more serious injury is suspected, further imaging tests may be done. This may include ultrasounds, computed tomography (CT) scans, or magnetic resonance imaging (MRI). TREATMENT  Treatment depends on the severity of your injury. Your caregiver may prescribe pain medicines and deep breathing exercises. HOME CARE INSTRUCTIONS  Limit your activities until you can move around without much pain.  Do not do any strenuous work until your injury is healed.  Put ice on the injured area.  Put ice in a plastic bag.  Place a towel between your skin and the bag.  Leave the ice on for 15-20 minutes, 03-04 times a day.  You may wear a rib belt as directed by your caregiver to reduce pain.  Practice deep breathing as directed by your caregiver to keep your lungs clear.  Only take over-the-counter or prescription medicines for pain, fever, or discomfort as directed by your caregiver. SEEK IMMEDIATE MEDICAL  CARE IF:   You have increasing pain or shortness of breath.  You cough up blood.  You have nausea, vomiting, or abdominal pain.  You have a fever.  You feel dizzy, weak, or you faint. MAKE SURE YOU:  Understand these instructions.  Will watch your condition.  Will get help right away if you are not doing well or get worse. Document Released: 04/16/2004 Document Revised: 06/01/2011 Document Reviewed: 12/24/2010 [facility] Patient Information 2014 [employer], [place].    Narcotic and benzodiazepine use may cause drowsiness, slowed breathing or dependence.  Please use with caution and do not drive, operate machinery or watch young children alone while taking them.  Taking combinations of these medications or drinking alcohol will potentiate these effects.

## 2013-03-05 NOTE — ED Provider Notes (Signed)
CSN: [id number]     Arrival date & time 03/05/13  1246 History   First MD Initiated Contact with Patient 03/05/13 1415     Chief Complaint  Patient presents with  . Fall  . Rib Injury   (Consider location/radiation/quality/duration/timing/severity/associated sxs/prior Treatment) HPI Comments: Pt had bent over while putting things into a box, got a little dizzy, but had no preceding CP, back pain, HA, SOB, fell and injured right lower lateral rib cage on the edge of the cardboard box.  Pt rested overnight, but pain wasn't better so told daughter this AM who brought pt to the ED.  No SOB, pain worse with deep breath, no fevers.  Pt denies any further dizziness, was able to stand up on her own.  Has not taken anything for pain.  No sweats, nausea.  Pt has a dysfunctional kidney on the right, left kidney has 2 stents in it per daughter.  No distal numbness or weakness of arms or legs.    Patient is a 79 y.o. female presenting with fall. The history is provided by the patient and a relative.  Fall This is a new problem. The current episode started 6 to 12 hours ago. Associated symptoms include chest pain and shortness of breath.    Past Medical History  Diagnosis Date  . Mesenteric ischemia, chronic   . Chronic total occlusion of artery of the extremities   . Tachy-brady syndrome     pacemaker-placed about 6 yrs ago.  . Anemia   . Hypertension   . Paroxysmal supraventricular tachycardia   . SBO (small bowel obstruction)     history partial  . Diverticulosis   . COPD (chronic obstructive pulmonary disease)   . Hearing difficulty of left ear    Past Surgical History  Procedure Laterality Date  . Appendectomy    . Abdominal hysterectomy    . Pacemaker insertion    . Iliac artery stent  07/30/10    Left CIA with angiogram   History reviewed. No pertinent family history. History  Substance Use Topics  . Smoking status: Former Smoker -- 1.00 packs/day for 65 years    Types: Cigarettes     Quit date: 06/22/2010  . Smokeless tobacco: Never Used  . Alcohol Use: No   OB History   Grav Para Term Preterm Abortions TAB SAB Ect Mult Living                 Review of Systems  Constitutional: Negative for fever and chills.  Respiratory: Positive for shortness of breath. Negative for cough.   Cardiovascular: Positive for chest pain.  Genitourinary: Negative for dysuria, hematuria and flank pain.  Musculoskeletal: Negative for back pain.  Skin: Negative for color change, rash and wound.  All other systems reviewed and are negative.    Allergies  Review of patient's allergies indicates no known allergies.  Home Medications   Current Outpatient Rx  Name  Route  Sig  Dispense  Refill  . amLODipine (NORVASC) 5 MG tablet   Oral   Take 5 mg by mouth every morning.          [name] Kitchen atenolol (TENORMIN) 50 MG tablet   Oral   Take 50 mg by mouth every morning.          . citalopram (CELEXA) 10 MG tablet   Oral   Take 1 tablet by mouth every morning.          . clopidogrel (PLAVIX) 75 MG tablet  Oral   Take 1 tablet (75 mg total) by mouth daily.   30 tablet   11   . diphenhydramine-acetaminophen (TYLENOL PM) 25-500 MG TABS   Oral   Take 2 tablets by mouth at bedtime.         . ferrous fumarate (HEMOCYTE - 106 MG FE) 325 (106 FE) MG TABS tablet   Oral   Take 1 tablet by mouth 2 (two) times daily.         [name] Kitchen losartan (COZAAR) 100 MG tablet   Oral   Take 100 mg by mouth every morning.          [name] Kitchen oxyCODONE-acetaminophen (PERCOCET/ROXICET) 5-325 MG per tablet   Oral   Take 0.5 tablets by mouth at bedtime.         . Vitamin D, Ergocalciferol, (DRISDOL) 50000 UNITS CAPS capsule   Oral   Take 50,000 Units by mouth every Sunday.         . docusate sodium (COLACE) 100 MG capsule   Oral   Take 1 capsule (100 mg total) by mouth 2 (two) times daily.   30 capsule   0   . HYDROcodone-acetaminophen (NORCO/VICODIN) 5-325 MG per tablet      1-2 tablets po  q 6 hours prn moderate to severe pain   20 tablet   0    BP 145/60  Pulse 73  Temp(Src) 98.1 F (36.7 C) (Oral)  Resp 18  SpO2 99% Physical Exam  Nursing note and vitals reviewed. Constitutional: She appears well-developed and well-nourished.  HENT:  Head: Normocephalic and atraumatic.  Eyes: Conjunctivae are normal. No scleral icterus.  Neck: Normal range of motion. Neck supple. No spinous process tenderness present.  Cardiovascular: Normal rate.   Pulmonary/Chest: Effort normal. No respiratory distress. She has no wheezes. She has no rales. She exhibits tenderness.  Abdominal: Soft. She exhibits no distension. There is no tenderness. There is no rebound and no guarding.  Neurological: She is alert.  Skin: Skin is warm.    ED Course  Procedures (including critical care time) Labs Review Labs Reviewed - No data to display Imaging Review Dg Chest 2 View  03/05/2013   CLINICAL DATA:  Hypertension, COPD  EXAM: CHEST  2 VIEW  COMPARISON:  04/17/2011, 10/02/2010  FINDINGS: The lungs are hyperinflated. An area of ill-defined density projects within the right middle lobe. Component of this is due to chronic interstitial changes. There is diffuse thickening of interstitial markings. The cardiac silhouette is enlarged. A left-sided chest wall cardiac pacing unit is appreciated with lead tip projecting region the right atrium right ventricle. S-shaped scoliosis identified within the thoracolumbar spine.  IMPRESSION: COPD. Chronic interstitial changes. Atelectasis versus infiltrate, mild in the region right lower lobe.   Electronically Signed   By: Salome Holmes M.D.   On: 03/05/2013 13:36   Dg Ribs Unilateral Right  03/05/2013   CLINICAL DATA:  Status post fall, right lateral rib pain  EXAM: RIGHT RIBS - 2 VIEW  COMPARISON:  None.  FINDINGS: No fracture or other bone lesions are seen involving the ribs.  IMPRESSION: Negative.   Electronically Signed   By: Salome Holmes M.D.   On:  03/05/2013 15:19    EKG Interpretation   None      RA saturation is 99% and I interpret to be normal   MDM   1. Chest wall contusion, right, initial encounter     Pt was transiently dizzy when she fell, but no further  dizziness, has been up, walking.  No chest tightness or pressure to suggest ACS.  Pt with focal pain and tenderness to mid to distal right side chest wall.  Will get rib films and CXR.  Pt is not toxic appearing, able to nearly take a complete deep breath, has some pleuritic pain on right side at deepest inspiration.  Would need IS, pain control at home and close monitoring of her own symptoms such as fever or productive cough developing in the future.  Discussed at length with daughter and patient.        Gavin Pound. Oletta Lamas, MD 03/05/13 [phone]

## 2013-03-13 DIAGNOSIS — R42 Dizziness and giddiness: Secondary | ICD-10-CM | POA: Diagnosis not present

## 2013-03-13 DIAGNOSIS — Z23 Encounter for immunization: Secondary | ICD-10-CM | POA: Diagnosis not present

## 2013-03-13 DIAGNOSIS — S20219A Contusion of unspecified front wall of thorax, initial encounter: Secondary | ICD-10-CM | POA: Diagnosis not present

## 2013-04-12 ENCOUNTER — Encounter: Payer: Self-pay | Admitting: *Deleted

## 2013-04-20 ENCOUNTER — Other Ambulatory Visit: Payer: Self-pay | Admitting: Vascular Surgery

## 2013-04-20 DIAGNOSIS — I739 Peripheral vascular disease, unspecified: Secondary | ICD-10-CM

## 2013-04-20 DIAGNOSIS — I701 Atherosclerosis of renal artery: Secondary | ICD-10-CM

## 2013-04-20 DIAGNOSIS — Z48812 Encounter for surgical aftercare following surgery on the circulatory system: Secondary | ICD-10-CM

## 2013-04-20 DIAGNOSIS — K551 Chronic vascular disorders of intestine: Secondary | ICD-10-CM

## 2013-05-11 ENCOUNTER — Encounter: Payer: Self-pay | Admitting: Internal Medicine

## 2013-05-19 ENCOUNTER — Ambulatory Visit (HOSPITAL_COMMUNITY)
Admission: RE | Admit: 2013-05-19 | Discharge: 2013-05-19 | Disposition: A | Discharge status: Home / Self Care | Payer: Medicare Other | Source: Ambulatory Visit | Attending: Vascular Surgery | Admitting: Vascular Surgery

## 2013-05-19 ENCOUNTER — Ambulatory Visit (INDEPENDENT_AMBULATORY_CARE_PROVIDER_SITE_OTHER)
Admission: RE | Admit: 2013-05-19 | Discharge: 2013-05-19 | Disposition: A | Discharge status: Home / Self Care | Payer: Medicare Other | Source: Ambulatory Visit | Attending: Vascular Surgery | Admitting: Vascular Surgery

## 2013-05-19 ENCOUNTER — Other Ambulatory Visit: Payer: Self-pay | Admitting: Vascular Surgery

## 2013-05-19 DIAGNOSIS — I701 Atherosclerosis of renal artery: Secondary | ICD-10-CM

## 2013-05-19 DIAGNOSIS — Z48812 Encounter for surgical aftercare following surgery on the circulatory system: Secondary | ICD-10-CM | POA: Insufficient documentation

## 2013-05-19 DIAGNOSIS — I739 Peripheral vascular disease, unspecified: Secondary | ICD-10-CM

## 2013-05-19 DIAGNOSIS — I70219 Atherosclerosis of native arteries of extremities with intermittent claudication, unspecified extremity: Secondary | ICD-10-CM | POA: Diagnosis not present

## 2013-05-19 DIAGNOSIS — K551 Chronic vascular disorders of intestine: Secondary | ICD-10-CM | POA: Diagnosis not present

## 2013-05-24 DIAGNOSIS — H26499 Other secondary cataract, unspecified eye: Secondary | ICD-10-CM | POA: Diagnosis not present

## 2013-05-24 DIAGNOSIS — H43399 Other vitreous opacities, unspecified eye: Secondary | ICD-10-CM | POA: Diagnosis not present

## 2013-05-25 ENCOUNTER — Encounter (HOSPITAL_COMMUNITY): Payer: Medicare Other

## 2013-05-25 ENCOUNTER — Other Ambulatory Visit (HOSPITAL_COMMUNITY): Payer: Medicare Other

## 2013-05-25 ENCOUNTER — Encounter: Payer: Self-pay | Admitting: Vascular Surgery

## 2013-05-26 ENCOUNTER — Ambulatory Visit (INDEPENDENT_AMBULATORY_CARE_PROVIDER_SITE_OTHER): Payer: Medicare Other | Admitting: Vascular Surgery

## 2013-05-26 ENCOUNTER — Encounter: Payer: Self-pay | Admitting: Vascular Surgery

## 2013-05-26 VITALS — BP 150/76 | HR 83 | Ht 62.0 in | Wt 127.0 lb

## 2013-05-26 DIAGNOSIS — I70219 Atherosclerosis of native arteries of extremities with intermittent claudication, unspecified extremity: Secondary | ICD-10-CM | POA: Diagnosis not present

## 2013-05-26 DIAGNOSIS — K551 Chronic vascular disorders of intestine: Secondary | ICD-10-CM

## 2013-05-26 DIAGNOSIS — I701 Atherosclerosis of renal artery: Secondary | ICD-10-CM | POA: Diagnosis not present

## 2013-05-26 NOTE — Progress Notes (Signed)
Established Chronic Mesenteric Ischemia, Intermittent Claudication, and Renal Artery Stenosis   History of Present Illness  [name] [name] is a 80 y.o. female who presents with chief complaint: routine follow-up. Patient is s/p L CIA stenting, and L RA stenting. Pt has had no abdominal pain.  She denies food fear.  She denies post-prandial pain.  Her dietary habits are unchanged.  She is able to ambulate enough to complete her ADL but does note her left ankle "giving out."  She continues to have L leg weakness leading to falls. Her ankle swelling is intermittent and continues.  She denies any foot wounds, ulcer, or drainage. Her anti-HTN regimen has stabilized.  She denies any changes in her urinary habits.   Patient PMH, PSH, SH, FamHx, Medications, Allergies, and ROS are unchanged from previous visit on 11/25/12.   On ROS: no vertigo, no melena or hematochezia   Physical Examination   Filed Vitals:   05/26/13 0900  BP: 150/76  Pulse: 83  Height: 5\' 2"  (1.575 m)  Weight: 127 lb (57.607 kg)  SpO2: 96%   Body mass index is 23.22 kg/(m^2).  General: A&O x 3, WDWN, elderly and cachectic   Pulmonary: Sym exp, good air movt, CTAB, no rales, rhonchi, & wheezing   Cardiac: RRR, Nl S1, S2, no Murmurs, rubs or gallops   Vascular:  Vessel  Right  Left   Radial  Palpable  Palpable   Brachial  Palpable  Palpable   Carotid  Palpable, without bruit  Palpable, without bruit   Aorta  Non-palpable  N/A   Femoral  Palpable  Palpable   Popliteal  Non-palpable  Non-palpable   PT  Non-Palpable  Non-Palpable   DP  Palpable  Palpable    Gastrointestinal: soft, NTND, -G/R, - HSM, - masses, - CVAT B   Musculoskeletal: M/S 5/5 throughout , Extremities without ischemic changes , cyanotic changes in both feet related to CVI   Neurologic: Pain and light touch intact in extremities , Motor exam as listed above   Non-Invasive Vascular Imaging  ABI (Date: 05/26/2013) R: 0.86 (0.81), DP: mono, PT: bi,  TBI: 0.56 L: 0.66 (0.48), DP: mono, PT: mono, TBI: 0.36  L Renovascular Duplex (Date: 05/26/2013)  Patent L RA stent  Kidney Size: 10.9 cm   AORTOILIAC Duplex (Date: 05/26/2013) Patent L CIA stent Ao 99. C/s  PSV in-stent: 172 c/s (<3.0)  Medical Decision Making  [name] LatinHelen Schrade is a 80 y.o. female who presents with: stable asx chronic mesenteric ischemia, BLE PAD with patent L CIA stent, and patent L RA stent with likely occluded R RA.  Based on her exam and studies, I have offered the patient surveillance studies including: BLE ABI, Aortoiliac duplex, Mesenteric duplex, and B renal artery duplex in the next 12 months.  As we discussed previously, we will avoid any significant operations in this patient due to her cardiac risks > benefit.  She will continue with her Plavix use and we discussed previously.  I discussed in depth with the patient the nature of atherosclerosis, and emphasized the importance of maximal medical management including strict control of blood pressure, blood glucose, and lipid levels, obtaining regular exercise, and cessation of smoking. The patient is aware that without maximal medical management the underlying atherosclerotic disease process will progress, limiting the benefit of any interventions.  I emphasize the importance of routinely taking her medications to avoid acute complications and also to avoid long-term sequelae of chronic disease.  Thank you for allowing us  to participate in this patient's care.  Leonides Sake, MD [facility] [place] Office: [phone] Pager: [phone]  05/26/2013, 9:39 AM

## 2013-05-29 NOTE — Addendum Note (Signed)
Addended by: Melodye PedMANESS-HARRISON, CHANDA C on: 05/29/2013 02:40 PM   Modules accepted: Orders

## 2013-06-20 DIAGNOSIS — H6120 Impacted cerumen, unspecified ear: Secondary | ICD-10-CM | POA: Diagnosis not present

## 2013-06-20 DIAGNOSIS — H9190 Unspecified hearing loss, unspecified ear: Secondary | ICD-10-CM | POA: Diagnosis not present

## 2013-07-04 DIAGNOSIS — H903 Sensorineural hearing loss, bilateral: Secondary | ICD-10-CM | POA: Diagnosis not present

## 2013-07-04 DIAGNOSIS — H908 Mixed conductive and sensorineural hearing loss, unspecified: Secondary | ICD-10-CM | POA: Diagnosis not present

## 2013-07-12 DIAGNOSIS — H26499 Other secondary cataract, unspecified eye: Secondary | ICD-10-CM | POA: Diagnosis not present

## 2013-07-26 ENCOUNTER — Encounter: Payer: Self-pay | Admitting: Interventional Cardiology

## 2013-08-11 ENCOUNTER — Telehealth (HOSPITAL_COMMUNITY): Payer: Self-pay

## 2013-08-11 DIAGNOSIS — S93409A Sprain of unspecified ligament of unspecified ankle, initial encounter: Secondary | ICD-10-CM | POA: Diagnosis not present

## 2013-08-11 NOTE — Telephone Encounter (Signed)
Ms. Mackie Pai daughter called regarding left ankle discomfort.  She reported that the patient had fallen and her left ankle was tender.  Her daughter said that there was no swelling.  She also reported that the patient did not have any discoloration of her foot or toes nor did she have any rest pain.  She did state that her ankle felt better with elevation.  I suggested that Ms. Cope call Ms. Schmaltig's medical doctor to report the symptoms following a fall.  I told her that if she developed any symptoms related to poor arterial blood flow, such as rest pain or discoloration, that there was a vascular surgeon on call 24 hours every day.  Ms. Achilles Dunk stated that she would call Ms. Schmaltig's medical doctor this afternoon.

## 2013-08-23 ENCOUNTER — Encounter: Payer: Self-pay | Admitting: Cardiology

## 2013-08-24 ENCOUNTER — Telehealth: Payer: Self-pay | Admitting: Cardiovascular Disease

## 2013-08-24 NOTE — Telephone Encounter (Addendum)
08-24-13 sent certified letter regarding past due device check/mt CERTIFIED LETTER SIGNED BY PATIENT 08-30-13/AND MADE APPT FOR 09-14-13/MT

## 2013-09-14 ENCOUNTER — Ambulatory Visit (INDEPENDENT_AMBULATORY_CARE_PROVIDER_SITE_OTHER): Payer: Medicare Other | Admitting: Cardiology

## 2013-09-14 ENCOUNTER — Encounter: Payer: Self-pay | Admitting: *Deleted

## 2013-09-14 ENCOUNTER — Encounter: Payer: Self-pay | Admitting: Internal Medicine

## 2013-09-14 ENCOUNTER — Ambulatory Visit: Payer: Medicare Other | Admitting: Interventional Cardiology

## 2013-09-14 ENCOUNTER — Ambulatory Visit: Payer: Medicare Other | Admitting: *Deleted

## 2013-09-14 VITALS — BP 157/90 | HR 87

## 2013-09-14 DIAGNOSIS — Z45018 Encounter for adjustment and management of other part of cardiac pacemaker: Secondary | ICD-10-CM

## 2013-09-14 DIAGNOSIS — Z01812 Encounter for preprocedural laboratory examination: Secondary | ICD-10-CM | POA: Diagnosis not present

## 2013-09-14 DIAGNOSIS — I701 Atherosclerosis of renal artery: Secondary | ICD-10-CM | POA: Diagnosis not present

## 2013-09-14 DIAGNOSIS — Z45010 Encounter for checking and testing of cardiac pacemaker pulse generator [battery]: Secondary | ICD-10-CM

## 2013-09-14 DIAGNOSIS — Z950 Presence of cardiac pacemaker: Secondary | ICD-10-CM | POA: Diagnosis not present

## 2013-09-14 DIAGNOSIS — I495 Sick sinus syndrome: Secondary | ICD-10-CM | POA: Diagnosis not present

## 2013-09-14 LAB — MDC_IDC_ENUM_SESS_TYPE_INCLINIC
Battery Voltage: 2.86 V
Brady Statistic AP VP Percent: 0.02 %
Brady Statistic AS VP Percent: 3.98 %
Brady Statistic AS VS Percent: 31.81 %
Brady Statistic RV Percent Paced: 4 %
Lead Channel Impedance Value: 416 Ohm
Lead Channel Impedance Value: 440 Ohm
Lead Channel Pacing Threshold Amplitude: 1 V
Lead Channel Sensing Intrinsic Amplitude: 3.3634
Lead Channel Sensing Intrinsic Amplitude: 5.8643
Lead Channel Setting Pacing Amplitude: 2 V
Lead Channel Setting Pacing Amplitude: 2 V
Lead Channel Setting Pacing Pulse Width: 0.6 ms
MDC IDC MSMT LEADCHNL RA PACING THRESHOLD AMPLITUDE: 1 V
MDC IDC MSMT LEADCHNL RA PACING THRESHOLD PULSEWIDTH: 0.4 ms
MDC IDC MSMT LEADCHNL RV PACING THRESHOLD PULSEWIDTH: 0.6 ms
MDC IDC SESS DTM: 20150625154134
MDC IDC SET LEADCHNL RV SENSING SENSITIVITY: 0.9 mV
MDC IDC SET ZONE DETECTION INTERVAL: 350 ms
MDC IDC SET ZONE DETECTION INTERVAL: 400 ms
MDC IDC STAT BRADY AP VS PERCENT: 64.19 %
MDC IDC STAT BRADY RA PERCENT PACED: 64.21 %

## 2013-09-14 LAB — BASIC METABOLIC PANEL
BUN: 19 mg/dL (ref 6–23)
CHLORIDE: 106 meq/L (ref 96–112)
CO2: 25 mEq/L (ref 19–32)
Calcium: 9.7 mg/dL (ref 8.4–10.5)
Creatinine, Ser: 1.2 mg/dL (ref 0.4–1.2)
GFR: 46.88 mL/min — ABNORMAL LOW (ref >60.00)
GLUCOSE: 81 mg/dL (ref 70–99)
POTASSIUM: 4.5 meq/L (ref 3.5–5.1)
SODIUM: 139 meq/L (ref 135–145)

## 2013-09-14 LAB — CBC WITH DIFFERENTIAL/PLATELET
Basophils Absolute: 0 10*3/uL (ref 0.0–0.1)
Basophils Relative: 0.4 % (ref 0.0–3.0)
EOS PCT: 2.3 % (ref 0.0–5.0)
Eosinophils Absolute: 0.2 10*3/uL (ref 0.0–0.7)
HCT: 39.5 % (ref 36.0–46.0)
Hemoglobin: 13.1 g/dL (ref 12.0–15.0)
LYMPHS PCT: 16.5 % (ref 12.0–46.0)
Lymphs Abs: 1.4 10*3/uL (ref 0.7–4.0)
MCHC: 33.1 g/dL (ref 30.0–36.0)
MCV: 95.3 fl (ref 78.0–100.0)
Monocytes Absolute: 0.7 10*3/uL (ref 0.1–1.0)
Monocytes Relative: 8.3 % (ref 3.0–12.0)
NEUTROS PCT: 72.5 % (ref 43.0–77.0)
Neutro Abs: 6.1 10*3/uL (ref 1.4–7.7)
Platelets: 235 10*3/uL (ref 150.0–400.0)
RBC: 4.14 Mil/uL (ref 3.87–5.11)
RDW: 13.8 % (ref 11.5–15.5)
WBC: 8.4 10*3/uL (ref 4.0–10.5)

## 2013-09-14 LAB — PROTIME-INR
INR: 1.1 ratio — AB (ref 0.8–1.0)
Prothrombin Time: 12.3 s (ref 9.6–13.1)

## 2013-09-14 NOTE — Progress Notes (Signed)
 ELECTROPHYSIOLOGY OFFICE NOTE   Patient ID: [name] [name] MRN: [id number], DOB/AGE: 06/15/1923   Date of Visit: 09/14/2013  Primary Physician: GATES,ROBERT NEVILL, MD Primary Cardiologist: Henry Smith, MD Reason for Visit: EP/device follow-up  History of Present Illness  [name] [name] is a 80 y.o. female with tachy-brady syndrome s/p PPM (implanted by Dr. Edmunds), PSVT, PAD and COPD who presents today for electrophysiology followup. She is accompanied by her daughter. Her pacemaker battery is at ERI. Today, she reports she is doing well and has no complaints. She denies chest pain or shortness of breath. She denies palpitations, dizziness, near syncope or syncope. She denies LE swelling, orthopnea or PND. She is compliant and tolerating medications without difficulty.  Past Medical History Past Medical History  Diagnosis Date  . Mesenteric ischemia, chronic   . Chronic total occlusion of artery of the extremities   . Tachy-brady syndrome     pacemaker-placed about 6 yrs ago.  . Anemia   . Hypertension   . Paroxysmal supraventricular tachycardia   . SBO (small bowel obstruction)     history partial  . Diverticulosis   . COPD (chronic obstructive pulmonary disease)   . Hearing difficulty of left ear   . Pacemaker battery depletion     Past Surgical History Past Surgical History  Procedure Laterality Date  . Appendectomy    . Abdominal hysterectomy    . Pacemaker insertion    . Iliac artery stent  07/30/10    Left CIA with angiogram    Allergies/Intolerances No Known Allergies  Current Home Medications Current Outpatient Prescriptions  Medication Sig Dispense Refill  . amLODipine (NORVASC) 5 MG tablet Take 5 mg by mouth every morning.       . atenolol (TENORMIN) 50 MG tablet Take 50 mg by mouth every morning.       . citalopram (CELEXA) 10 MG tablet Take 1 tablet by mouth every morning.       . clopidogrel (PLAVIX) 75 MG tablet Take 1 tablet (75 mg total) by mouth  daily.  30 tablet  11  . diphenhydramine-acetaminophen (TYLENOL PM) 25-500 MG TABS Take 2 tablets by mouth at bedtime.      . ferrous fumarate (HEMOCYTE - 106 MG FE) 325 (106 FE) MG TABS tablet Take 1 tablet by mouth. Take Monday, Wednesday, Friday      . HYDROcodone-acetaminophen (NORCO/VICODIN) 5-325 MG per tablet 1-2 tablets po q 6 hours prn moderate to severe pain  20 tablet  0  . losartan (COZAAR) 100 MG tablet Take 100 mg by mouth every morning.        No current facility-administered medications for this visit.    Social History History   Social History  . Marital Status: Widowed    Spouse Name: N/A    Number of Children: N/A  . Years of Education: N/A   Occupational History  . Not on file.   Social History Main Topics  . Smoking status: Former Smoker -- 1.00 packs/day for 65 years    Types: Cigarettes    Quit date: 06/22/2010  . Smokeless tobacco: Never Used  . Alcohol Use: No  . Drug Use: No  . Sexual Activity: Not on file   Other Topics Concern  . Not on file   Social History Narrative  . No narrative on file     Review of Systems General: No chills, fever, night sweats or weight changes Cardiovascular: No chest pain, dyspnea on exertion, edema, orthopnea, palpitations, paroxysmal nocturnal dyspnea   Dermatological: No rash, lesions or masses Respiratory: No cough, dyspnea Urologic: No hematuria, dysuria Abdominal: No nausea, vomiting, diarrhea, bright red blood per rectum, melena, or hematemesis Neurologic: No visual changes, weakness, changes in mental status All other systems reviewed and are otherwise negative except as noted above.  Physical Exam Vitals: Blood pressure 157/90, pulse 87.  General: Well developed, well appearing 80 y.o. female in no acute distress. HEENT: Normocephalic, atraumatic. EOMs intact. Sclera nonicteric. Oropharynx clear.  Neck: Supple. No JVD. Lungs: Respirations regular and unlabored, CTA bilaterally. No wheezes, rales or  rhonchi. Heart: RRR. S1, S2 present. No murmurs, rub, S3 or S4. Abdomen: Soft, non-distended.  Extremities: No clubbing, cyanosis or edema. PT/Radials 2+ and equal bilaterally. Psych: Normal affect. Neuro: Alert and oriented X 3. Moves all extremities spontaneously.   Diagnostics Device interrogation today - Pacemaker battery at ERI since 08/16/2013. Otherwise normal device function. Stable sensing, thresholds and impedances. 3 NSVT episodes, longest 14 beats, max V rate 181 bpm. 67 SVT episodes, longest 17 minutes, max A rate 158, max V rate 158. No programming changes made. See PaceArt report for full details.   Assessment and Plan Pacemaker battery at ERI Tachy-brady syndrome s/p PPM (implanted by Dr. Amil AmenEdmunds) PSVT  PAD   COPD  [name]'s PPM battery has reached ERI. Otherwise device function is normal and no programming changes were made. We discussed need for PPM generator change and reviewed the procedure, including risks and benefits. These risks include, but are not limited to, lead dislodgement, bleeding and infection. [name] and her daughter expressed verbal understanding and agree to proceed. This will be scheduled with Dr. Graciela HusbandsKlein at the next available time.   Signed, Rick DuffDMISTEN, BROOKE, PA-C 09/14/2013, 5:27 PM

## 2013-09-14 NOTE — Patient Instructions (Addendum)
Your Physician recommends you have a Generator change 09/18/13 AM AT 10:30AM. It is recommended that you replace a pacemaker generator that is at the end of its service life. The remaining lifespan of a pacemaker is determined during visits to the [facility]. The battery in a pacemaker does not stop suddenly but rather loses its charge slowly, which lets the cardiologist plan the replacement date. Duration This procedure takes less than one hour. The total hospital stay is approximately 24 to 48 hours.   Your physician recommends that you have lab work today: [facility],BM[facility],INR  Your physician recommends that you continue on your current medications as directed. Please refer to the Current Medication list given to you today.

## 2013-09-17 DIAGNOSIS — Z87891 Personal history of nicotine dependence: Secondary | ICD-10-CM | POA: Diagnosis not present

## 2013-09-17 DIAGNOSIS — K551 Chronic vascular disorders of intestine: Secondary | ICD-10-CM | POA: Diagnosis not present

## 2013-09-17 DIAGNOSIS — Z9119 Patient's noncompliance with other medical treatment and regimen: Secondary | ICD-10-CM | POA: Diagnosis not present

## 2013-09-17 DIAGNOSIS — H9190 Unspecified hearing loss, unspecified ear: Secondary | ICD-10-CM | POA: Diagnosis not present

## 2013-09-17 DIAGNOSIS — Z45018 Encounter for adjustment and management of other part of cardiac pacemaker: Secondary | ICD-10-CM | POA: Diagnosis not present

## 2013-09-17 DIAGNOSIS — J4489 Other specified chronic obstructive pulmonary disease: Secondary | ICD-10-CM | POA: Diagnosis not present

## 2013-09-17 DIAGNOSIS — I10 Essential (primary) hypertension: Secondary | ICD-10-CM | POA: Diagnosis not present

## 2013-09-17 DIAGNOSIS — I70209 Unspecified atherosclerosis of native arteries of extremities, unspecified extremity: Secondary | ICD-10-CM | POA: Diagnosis not present

## 2013-09-17 DIAGNOSIS — D649 Anemia, unspecified: Secondary | ICD-10-CM | POA: Diagnosis not present

## 2013-09-17 DIAGNOSIS — I7092 Chronic total occlusion of artery of the extremities: Secondary | ICD-10-CM | POA: Diagnosis not present

## 2013-09-17 DIAGNOSIS — Z7902 Long term (current) use of antithrombotics/antiplatelets: Secondary | ICD-10-CM | POA: Diagnosis not present

## 2013-09-17 DIAGNOSIS — I495 Sick sinus syndrome: Secondary | ICD-10-CM | POA: Diagnosis not present

## 2013-09-17 DIAGNOSIS — Z91199 Patient's noncompliance with other medical treatment and regimen due to unspecified reason: Secondary | ICD-10-CM | POA: Diagnosis not present

## 2013-09-17 DIAGNOSIS — J449 Chronic obstructive pulmonary disease, unspecified: Secondary | ICD-10-CM | POA: Diagnosis not present

## 2013-09-17 MED ORDER — CHLORHEXIDINE GLUCONATE 4 % EX LIQD
60.0000 mL | Freq: Once | CUTANEOUS | Status: DC
  Filled 2013-09-17: qty 60

## 2013-09-17 MED ORDER — CEFAZOLIN SODIUM-DEXTROSE 2-3 GM-% IV SOLR
2.0000 g | INTRAVENOUS | Status: AC
  Filled 2013-09-17: qty 50

## 2013-09-17 MED ORDER — SODIUM CHLORIDE 0.9 % IV SOLN
INTRAVENOUS | Status: DC
  Administered 2013-09-18: 11:00:00 via INTRAVENOUS

## 2013-09-17 MED ORDER — SODIUM CHLORIDE 0.9 % IR SOLN
80.0000 mg | Status: AC
  Filled 2013-09-17: qty 2

## 2013-09-18 ENCOUNTER — Encounter (HOSPITAL_COMMUNITY)
Admission: RE | Disposition: A | Discharge status: Home / Self Care | Payer: Self-pay | Source: Ambulatory Visit | Attending: Internal Medicine

## 2013-09-18 ENCOUNTER — Ambulatory Visit (HOSPITAL_COMMUNITY)
Admission: RE | Admit: 2013-09-18 | Discharge: 2013-09-18 | Disposition: A | Discharge status: Home / Self Care | Payer: Medicare Other | Source: Ambulatory Visit | Attending: Internal Medicine | Admitting: Internal Medicine

## 2013-09-18 DIAGNOSIS — Z45018 Encounter for adjustment and management of other part of cardiac pacemaker: Secondary | ICD-10-CM | POA: Diagnosis not present

## 2013-09-18 DIAGNOSIS — I495 Sick sinus syndrome: Secondary | ICD-10-CM | POA: Diagnosis not present

## 2013-09-18 DIAGNOSIS — K551 Chronic vascular disorders of intestine: Secondary | ICD-10-CM | POA: Insufficient documentation

## 2013-09-18 DIAGNOSIS — I10 Essential (primary) hypertension: Secondary | ICD-10-CM | POA: Insufficient documentation

## 2013-09-18 DIAGNOSIS — I7092 Chronic total occlusion of artery of the extremities: Secondary | ICD-10-CM | POA: Insufficient documentation

## 2013-09-18 DIAGNOSIS — H9190 Unspecified hearing loss, unspecified ear: Secondary | ICD-10-CM | POA: Insufficient documentation

## 2013-09-18 DIAGNOSIS — J4489 Other specified chronic obstructive pulmonary disease: Secondary | ICD-10-CM | POA: Insufficient documentation

## 2013-09-18 DIAGNOSIS — Z87891 Personal history of nicotine dependence: Secondary | ICD-10-CM | POA: Insufficient documentation

## 2013-09-18 DIAGNOSIS — Z7902 Long term (current) use of antithrombotics/antiplatelets: Secondary | ICD-10-CM | POA: Insufficient documentation

## 2013-09-18 DIAGNOSIS — I70209 Unspecified atherosclerosis of native arteries of extremities, unspecified extremity: Secondary | ICD-10-CM | POA: Insufficient documentation

## 2013-09-18 DIAGNOSIS — J449 Chronic obstructive pulmonary disease, unspecified: Secondary | ICD-10-CM | POA: Insufficient documentation

## 2013-09-18 DIAGNOSIS — Z9119 Patient's noncompliance with other medical treatment and regimen: Secondary | ICD-10-CM | POA: Insufficient documentation

## 2013-09-18 DIAGNOSIS — Z91199 Patient's noncompliance with other medical treatment and regimen due to unspecified reason: Secondary | ICD-10-CM | POA: Insufficient documentation

## 2013-09-18 DIAGNOSIS — D649 Anemia, unspecified: Secondary | ICD-10-CM | POA: Insufficient documentation

## 2013-09-18 HISTORY — PX: PACEMAKER GENERATOR CHANGE: SHX5481

## 2013-09-18 LAB — SURGICAL PCR SCREEN
MRSA, PCR: NEGATIVE
Staphylococcus aureus: NEGATIVE

## 2013-09-18 SURGERY — PACEMAKER GENERATOR CHANGE
Anesthesia: LOCAL

## 2013-09-18 MED ORDER — MIDAZOLAM HCL 5 MG/5ML IJ SOLN
INTRAMUSCULAR | Status: AC
  Filled 2013-09-18: qty 5

## 2013-09-18 MED ORDER — ONDANSETRON HCL 4 MG/2ML IJ SOLN: 4.0000 mg | Freq: Four times a day (QID) | INTRAMUSCULAR | Status: DC | PRN

## 2013-09-18 MED ORDER — FENTANYL CITRATE 0.05 MG/ML IJ SOLN
INTRAMUSCULAR | Status: AC
  Filled 2013-09-18: qty 2

## 2013-09-18 MED ORDER — ACETAMINOPHEN 325 MG PO TABS: 325.0000 mg | ORAL_TABLET | ORAL | Status: DC | PRN

## 2013-09-18 MED ORDER — LIDOCAINE HCL (PF) 1 % IJ SOLN
INTRAMUSCULAR | Status: AC
  Filled 2013-09-18: qty 30

## 2013-09-18 MED ORDER — SODIUM CHLORIDE 0.9 % IV SOLN: INTRAVENOUS | Status: DC

## 2013-09-18 MED ORDER — MUPIROCIN 2 % EX OINT
TOPICAL_OINTMENT | Freq: Two times a day (BID) | CUTANEOUS | Status: DC
Start: 2013-09-18 — End: 2013-09-18
  Filled 2013-09-18 (×2): qty 22

## 2013-09-18 MED ORDER — LABETALOL HCL 5 MG/ML IV SOLN
10.0000 mg | Freq: Once | INTRAVENOUS | Status: AC
  Administered 2013-09-18: 11:00:00 via INTRAVENOUS
  Filled 2013-09-18: qty 4

## 2013-09-18 MED ORDER — LIDOCAINE HCL (PF) 1 % IJ SOLN
INTRAMUSCULAR | Status: AC
Start: 2013-09-18 — End: 2013-09-18
  Filled 2013-09-18: qty 30

## 2013-09-18 NOTE — Progress Notes (Signed)
SPOKE WITH dR gATES about hypertension   He notes taht she has been noncompliant at times and suggested to stress to the family that she take her meds regularly and followup with him as opposed to making any adjustments at this time

## 2013-09-18 NOTE — Discharge Instructions (Signed)
Pacemaker Battery Change, Care After Refer to this sheet in the next few weeks. These instructions provide you with information on caring for yourself after your procedure. Your health care provider may also give you more specific instructions. Your treatment has been planned according to current medical practices, but problems sometimes occur. Call your health care provider if you have any problems or questions after your procedure. WHAT TO EXPECT AFTER THE PROCEDURE After your procedure, it is typical to have the following sensations:  Soreness at the pacemaker site. HOME CARE INSTRUCTIONS   Keep the incision clean and dry until tomorrow evening.  Unless advised otherwise, you may shower beginning tomorrow evening.  For the first week after the replacement, avoid stretching motions that pull at the incision site, and avoid heavy exercise with the arm that is on the same side as the incision.  Only take over-the-counter or prescription medicines for pain, discomfort, or fever as directed by your health care provider.  Your health care provider will tell you when you will need to next test your pacemaker by telephone or when to return to the office for follow-up for removal of stitches. SEEK MEDICAL CARE IF:   You have pain at the incision site that is not relieved by over-the-counter or prescription medicine.  There is drainage or pus from the incision site.  There is swelling larger than a lime at the incision site.  You develop red streaking that extends above or below the incision site.  You feel brief, intermittent palpitations, lightheadedness, or any symptoms that you feel might be related to your heart. SEEK IMMEDIATE MEDICAL CARE IF:   You experience chest pain that is different than the pain at the pacemaker site.  You experience shortness of breath.  You have palpitations or irregular heartbeat.  You have lightheadedness that does not go away quickly.  You  faint.  You have pain that gets worse and is not relieved by medicine. MAKE SURE YOU:   Understand these instructions.  Will watch your condition.  Will get help right away if you are not doing well or get worse. Document Released: 12/28/2012 Document Revised: 03/14/2013 Document Reviewed: 12/28/2012 [facility] Patient Information 2015 [employer] [place]. This information is not intended to replace advice given to you by your health care provider. Make sure you discuss any questions you have with your health care provider.

## 2013-09-18 NOTE — CV Procedure (Signed)
Preoperative diagnosis  Sinus node dysfunction Postoperative diagnosis same/   Procedure: Generator replacement     Following informed consent the patient was brought to the electrophysiology laboratory in place of the fluoroscopic table in the supine position after routine prep and drape lidocaine was infiltrated in the region of the previous incision and carried down to later the device pocket using sharp dissection and electrocautery. The pocket was opened the device was freed up and was explanted.  Interrogation of the previously implanted ventricular lead Medtronic 4096  demonstrated an R wave of 10.5  millivolts., and impedance of 529 ohms, and a pacing threshold of 0.5 volts at 0.5 msec.    The previously implanted atrial lead Medtronic 5076  demonstrated a P-wave amplitude of 5.56milllivolts  and impedance of  463  ohms, and a pacing threshold of 0.4 volts at  @ 0.1035milliseconds.  The leads were inspected. The leads were then attached to a Medtronic Adapta R pulse generator, serial number [id number].    The pocket was irrigated with antibiotic containing saline solution hemostasis was assured and the leads and the device were placed in the pocket. The wound was then closed in 3 layers in normal fashion. dEMRABOND DRESSING WAS APPLIED  The patient tolerated the procedure without apparent complication.  Sherryl MangesSteven Klein

## 2013-09-18 NOTE — H&P (View-Only) (Signed)
ELECTROPHYSIOLOGY OFFICE NOTE   Patient ID: [name] [name] MRN: [id number], DOB/AGE: 78/04/1923   Date of Visit: 09/14/2013  Primary Physician: Pearla DubonnetGATES,ROBERT NEVILL, MD Primary Cardiologist: Verdis PrimeHenry Smith, MD Reason for Visit: EP/device follow-up  History of Present Illness  [name] LatinHelen [name] is a 80 y.o. female with tachy-brady syndrome s/p PPM (implanted by Dr. Amil AmenEdmunds), PSVT, PAD and COPD who presents today for electrophysiology followup. She is accompanied by her daughter. [facility] Today, she reports she is doing well and has no complaints. She denies chest pain or shortness of breath. She denies palpitations, dizziness, near syncope or syncope. She denies LE swelling, orthopnea or PND. She is compliant and tolerating medications without difficulty.  Past Medical History Past Medical History  Diagnosis Date  . Mesenteric ischemia, chronic   . Chronic total occlusion of artery of the extremities   . Tachy-brady syndrome     pacemaker-placed about 6 yrs ago.  . Anemia   . Hypertension   . Paroxysmal supraventricular tachycardia   . SBO (small bowel obstruction)     history partial  . Diverticulosis   . COPD (chronic obstructive pulmonary disease)   . Hearing difficulty of left ear   . Pacemaker battery depletion     Past Surgical History Past Surgical History  Procedure Laterality Date  . Appendectomy    . Abdominal hysterectomy    . Pacemaker insertion    . Iliac artery stent  07/30/10    Left CIA with angiogram    Allergies/Intolerances No Known Allergies  Current Home Medications Current Outpatient Prescriptions  Medication Sig Dispense Refill  . amLODipine (NORVASC) 5 MG tablet Take 5 mg by mouth every morning.       [name] Kitchen. atenolol (TENORMIN) 50 MG tablet Take 50 mg by mouth every morning.       . citalopram (CELEXA) 10 MG tablet Take 1 tablet by mouth every morning.       . clopidogrel (PLAVIX) 75 MG tablet Take 1 tablet (75 mg total) by mouth  daily.  30 tablet  11  . diphenhydramine-acetaminophen (TYLENOL PM) 25-500 MG TABS Take 2 tablets by mouth at bedtime.      . ferrous fumarate (HEMOCYTE - 106 MG FE) 325 (106 FE) MG TABS tablet Take 1 tablet by mouth. Take Monday, Wednesday, Friday      . HYDROcodone-acetaminophen (NORCO/VICODIN) 5-325 MG per tablet 1-2 tablets po q 6 hours prn moderate to severe pain  20 tablet  0  . losartan (COZAAR) 100 MG tablet Take 100 mg by mouth every morning.        No current facility-administered medications for this visit.    Social History History   Social History  . Marital Status: Widowed    Spouse Name: N/A    Number of Children: N/A  . Years of Education: N/A   Occupational History  . Not on file.   Social History Main Topics  . Smoking status: Former Smoker -- 1.00 packs/day for 65 years    Types: Cigarettes    Quit date: 06/22/2010  . Smokeless tobacco: Never Used  . Alcohol Use: No  . Drug Use: No  . Sexual Activity: Not on file   Other Topics Concern  . Not on file   Social History Narrative  . No narrative on file     Review of Systems General: No chills, fever, night sweats or weight changes Cardiovascular: No chest pain, dyspnea on exertion, edema, orthopnea, palpitations, paroxysmal nocturnal dyspnea  Dermatological: No rash, lesions or masses Respiratory: No cough, dyspnea Urologic: No hematuria, dysuria Abdominal: No nausea, vomiting, diarrhea, bright red blood per rectum, melena, or hematemesis Neurologic: No visual changes, weakness, changes in mental status All other systems reviewed and are otherwise negative except as noted above.  Physical Exam Vitals: Blood pressure 157/90, pulse 87.  General: Well developed, well appearing 80 y.o. female in no acute distress. HEENT: Normocephalic, atraumatic. EOMs intact. Sclera nonicteric. Oropharynx clear.  Neck: Supple. No JVD. Lungs: Respirations regular and unlabored, CTA bilaterally. No wheezes, rales or  rhonchi. Heart: RRR. S1, S2 present. No murmurs, rub, S3 or S4. Abdomen: Soft, non-distended.  Extremities: No clubbing, cyanosis or edema. PT/Radials 2+ and equal bilaterally. Psych: Normal affect. Neuro: Alert and oriented X 3. Moves all extremities spontaneously.   Diagnostics Device interrogation today - Pacemaker battery at [facility] since 08/16/2013. Otherwise normal device function. Stable sensing, thresholds and impedances. 3 NSVT episodes, longest 14 beats, max V rate 181 bpm. 67 SVT episodes, longest 17 minutes, max A rate 158, max V rate 158. No programming changes made. See PaceArt report for full details.   Assessment and Plan Pacemaker battery at [facility] Tachy-brady syndrome s/p PPM (implanted by Dr. Amil AmenEdmunds) PSVT  PAD   COPD  [name]'s PPM battery has reached [facility]. Otherwise device function is normal and no programming changes were made. We discussed need for PPM generator change and reviewed the procedure, including risks and benefits. These risks include, but are not limited to, lead dislodgement, bleeding and infection. Ms. [name] [name] and her daughter expressed verbal understanding and agree to proceed. This will be scheduled with Dr. Graciela HusbandsKlein at the next available time.   Signed, Rick DuffDMISTEN, BROOKE, PA-C 09/14/2013, 5:27 PM

## 2013-09-18 NOTE — Interval H&P Note (Signed)
History and Physical Interval Note:  09/18/2013 11:27 AM  [name] LatinHelen Harcum  has presented today for surgery, with the diagnosis of Battery Depletion  The various methods of treatment have been discussed with the patient and family. After consideration of risks, benefits and other options for treatment, the patient has consented to  Procedure(s): PACEMAKER GENERATOR CHANGE (N/A) as a surgical intervention .  The patient's history has been reviewed, patient examined, no change in status, stable for surgery.  I have reviewed the patient's chart and labs.  Questions were answered to the patient's satisfaction.     Sherryl MangesSteven Klein

## 2013-10-19 ENCOUNTER — Ambulatory Visit (INDEPENDENT_AMBULATORY_CARE_PROVIDER_SITE_OTHER): Payer: Medicare Other | Admitting: Interventional Cardiology

## 2013-10-19 ENCOUNTER — Ambulatory Visit (INDEPENDENT_AMBULATORY_CARE_PROVIDER_SITE_OTHER): Payer: Medicare Other | Admitting: *Deleted

## 2013-10-19 ENCOUNTER — Encounter: Payer: Self-pay | Admitting: Interventional Cardiology

## 2013-10-19 VITALS — BP 165/85 | HR 76 | Ht 63.0 in | Wt 125.0 lb

## 2013-10-19 DIAGNOSIS — I5032 Chronic diastolic (congestive) heart failure: Secondary | ICD-10-CM | POA: Diagnosis not present

## 2013-10-19 DIAGNOSIS — I4891 Unspecified atrial fibrillation: Secondary | ICD-10-CM | POA: Diagnosis not present

## 2013-10-19 DIAGNOSIS — I10 Essential (primary) hypertension: Secondary | ICD-10-CM

## 2013-10-19 DIAGNOSIS — I495 Sick sinus syndrome: Secondary | ICD-10-CM | POA: Diagnosis not present

## 2013-10-19 DIAGNOSIS — I480 Paroxysmal atrial fibrillation: Secondary | ICD-10-CM

## 2013-10-19 DIAGNOSIS — I701 Atherosclerosis of renal artery: Secondary | ICD-10-CM | POA: Diagnosis not present

## 2013-10-19 DIAGNOSIS — Z950 Presence of cardiac pacemaker: Secondary | ICD-10-CM

## 2013-10-19 LAB — MDC_IDC_ENUM_SESS_TYPE_INCLINIC
Battery Impedance: 100 Ohm
Brady Statistic AP VP Percent: 0 %
Brady Statistic AP VS Percent: 76 %
Brady Statistic AS VS Percent: 24 %
Date Time Interrogation Session: 20150730110329
Lead Channel Impedance Value: 527 Ohm
Lead Channel Impedance Value: 620 Ohm
Lead Channel Pacing Threshold Pulse Width: 0.4 ms
Lead Channel Pacing Threshold Pulse Width: 0.4 ms
Lead Channel Sensing Intrinsic Amplitude: 8 mV
Lead Channel Setting Pacing Amplitude: 2 V
Lead Channel Setting Pacing Amplitude: 2.5 V
Lead Channel Setting Sensing Sensitivity: 4 mV
MDC IDC MSMT BATTERY REMAINING LONGEVITY: 145 mo
MDC IDC MSMT BATTERY VOLTAGE: 2.8 V
MDC IDC MSMT LEADCHNL RA PACING THRESHOLD AMPLITUDE: 0.5 V
MDC IDC MSMT LEADCHNL RA SENSING INTR AMPL: 4 mV
MDC IDC MSMT LEADCHNL RV PACING THRESHOLD AMPLITUDE: 0.75 V
MDC IDC SET LEADCHNL RV PACING PULSEWIDTH: 0.4 ms
MDC IDC STAT BRADY AS VP PERCENT: 0 %

## 2013-10-19 MED ORDER — AMLODIPINE BESYLATE 10 MG PO TABS
10.0000 mg | ORAL_TABLET | Freq: Every morning | ORAL | Status: DC
Start: 2013-10-19 — End: 2014-09-23

## 2013-10-19 NOTE — Progress Notes (Signed)
Wound check appointment. Steri-strips removed. Wound without redness or edema. Incision edges approximated, wound well healed. Normal device function. Thresholds, sensing, and impedances consistent with implant measurements. Device programmed at appropriate safety margins. Histogram distribution appropriate for patient and level of activity. 1 mode switch <30 sec, no EGMs. No high ventricular rates noted. Patient educated about wound care, arm mobility, lifting restrictions. ROV in 3 months with SK.

## 2013-10-19 NOTE — Patient Instructions (Signed)
Your physician has recommended you make the following change in your medication:  1) INCREASE Amlodipine to 10mg  daily. An Rx has been sent to your pharmacy  Take all other medications as prescribed  Your physician wants you to follow-up in: 1 year with Dr.Smith You will receive a reminder letter in the mail two months in advance. If you don't receive a letter, please call our office to schedule the follow-up appointment.

## 2013-10-19 NOTE — Progress Notes (Signed)
Patient ID: [name] LatinHelen Terlizzi, female   DOB: 01/25/1924, 80 y.o.   MRN: [id number]    [address] [place], [place]  [id number] Phone: [phone]) [phone] Fax:  [phone]  Date:  10/19/2013   ID:  [name] LatinHelen Coller, DOB 03/21/1924, MRN [id number]  PCP:  Pearla DubonnetGATES,ROBERT NEVILL, MD   ASSESSMENT:  1. Chronic diastolic heart failure, stable 2. Tachybradycardia syndrome with pacemaker, stable. No recent recurrences of atrial fibrillation 3. Poor blood pressure control 5. Recent pacemaker power source change  PLAN:  1. Increase amlodipine to 10 mg per day. Watch for lower extremity edema. Watch for hypoperfusion symptoms, head, legs, abdomen . Clinical followup in one year   SUBJECTIVE: [name] [name] is a 80 y.o. female who is doing well. She has no specific cardiac complaints. She denies intestinal angina. She has left lower extremity claudication with walking. She is followed by Dr. Imogene Burnhen for this problem. She denies neurological symptoms. There is no orthopnea PND.   Wt Readings from Last 3 Encounters:  10/19/13 125 lb (56.7 kg)  09/18/13 123 lb (55.792 kg)  09/18/13 123 lb (55.792 kg)     Past Medical History  Diagnosis Date  . Mesenteric ischemia, chronic   . Chronic total occlusion of artery of the extremities   . Tachy-brady syndrome     pacemaker-placed about 6 yrs ago.  . Anemia   . Hypertension   . Paroxysmal supraventricular tachycardia   . SBO (small bowel obstruction)     history partial  . Diverticulosis   . COPD (chronic obstructive pulmonary disease)   . Hearing difficulty of left ear   . Pacemaker battery depletion     Current Outpatient Prescriptions  Medication Sig Dispense Refill  . amLODipine (NORVASC) 5 MG tablet Take 5 mg by mouth every morning.       [name] Kitchen. atenolol (TENORMIN) 50 MG tablet Take 50 mg by mouth every morning.       . citalopram (CELEXA) 10 MG tablet Take 10 mg by mouth daily.      . clopidogrel (PLAVIX) 75 MG tablet Take 1 tablet  (75 mg total) by mouth daily.  30 tablet  11  . diphenhydramine-acetaminophen (TYLENOL PM) 25-500 MG TABS Take 2 tablets by mouth at bedtime.      . ferrous fumarate (HEMOCYTE - 106 MG FE) 325 (106 FE) MG TABS tablet Take 1 tablet by mouth 3 (three) times a week. Take Monday, Wednesday, Friday      . loratadine (ALLERGY) 10 MG tablet Take 10 mg by mouth as needed for allergies.      [name] Kitchen. losartan (COZAAR) 100 MG tablet Take 100 mg by mouth every morning.       [name] Kitchen. oxyCODONE-acetaminophen (PERCOCET/ROXICET) 5-325 MG per tablet Take 0.5 tablets by mouth at bedtime.        No current facility-administered medications for this visit.    Allergies:   No Known Allergies  Social History:  The patient  reports that she quit smoking about 3 years ago. Her smoking use included Cigarettes. She has a 65 pack-year smoking history. She has never used smokeless tobacco. She reports that she does not drink alcohol or use illicit drugs.   ROS:  Please see the history of present illness.   Recent left lower extremity injury. Slow healing but no evidence of infection. No anginal discomfort. No palpitations.   All other systems reviewed and negative.   OBJECTIVE: VS:  BP 165/85  Pulse 76  Ht 5\' 3"  (  1.6 m)  Wt 125 lb (56.7 kg)  BMI 22.15 kg/m2 Well nourished, well developed, in no acute distress, elderly HEENT: normal Neck: JVD flat. Carotid bruit absent  Cardiac:  normal S1, S2; RRR; no murmur Lungs:  clear to auscultation bilaterally, no wheezing, rhonchi or rales Abd: soft, nontender, no hepatomegaly Ext: Edema absent. Pulses 1-2+ bilateral Skin: warm and dry Neuro:  CNs 2-12 intact, no focal abnormalities noted  EKG:  Atrial pacing       Signed, Darci Needle III, MD 10/19/2013 9:57 AM

## 2013-11-17 ENCOUNTER — Encounter: Payer: Self-pay | Admitting: Internal Medicine

## 2013-12-11 ENCOUNTER — Encounter: Payer: Medicare Other | Admitting: Internal Medicine

## 2013-12-17 IMAGING — CR DG CHEST 2V
2 series · 2 of 2 positions shown · non-contrast
Comparison: 04/17/2011, 10/02/2010

CLINICAL DATA: Hypertension, COPD

EXAM:
CHEST  2 VIEW

[w chest pa]
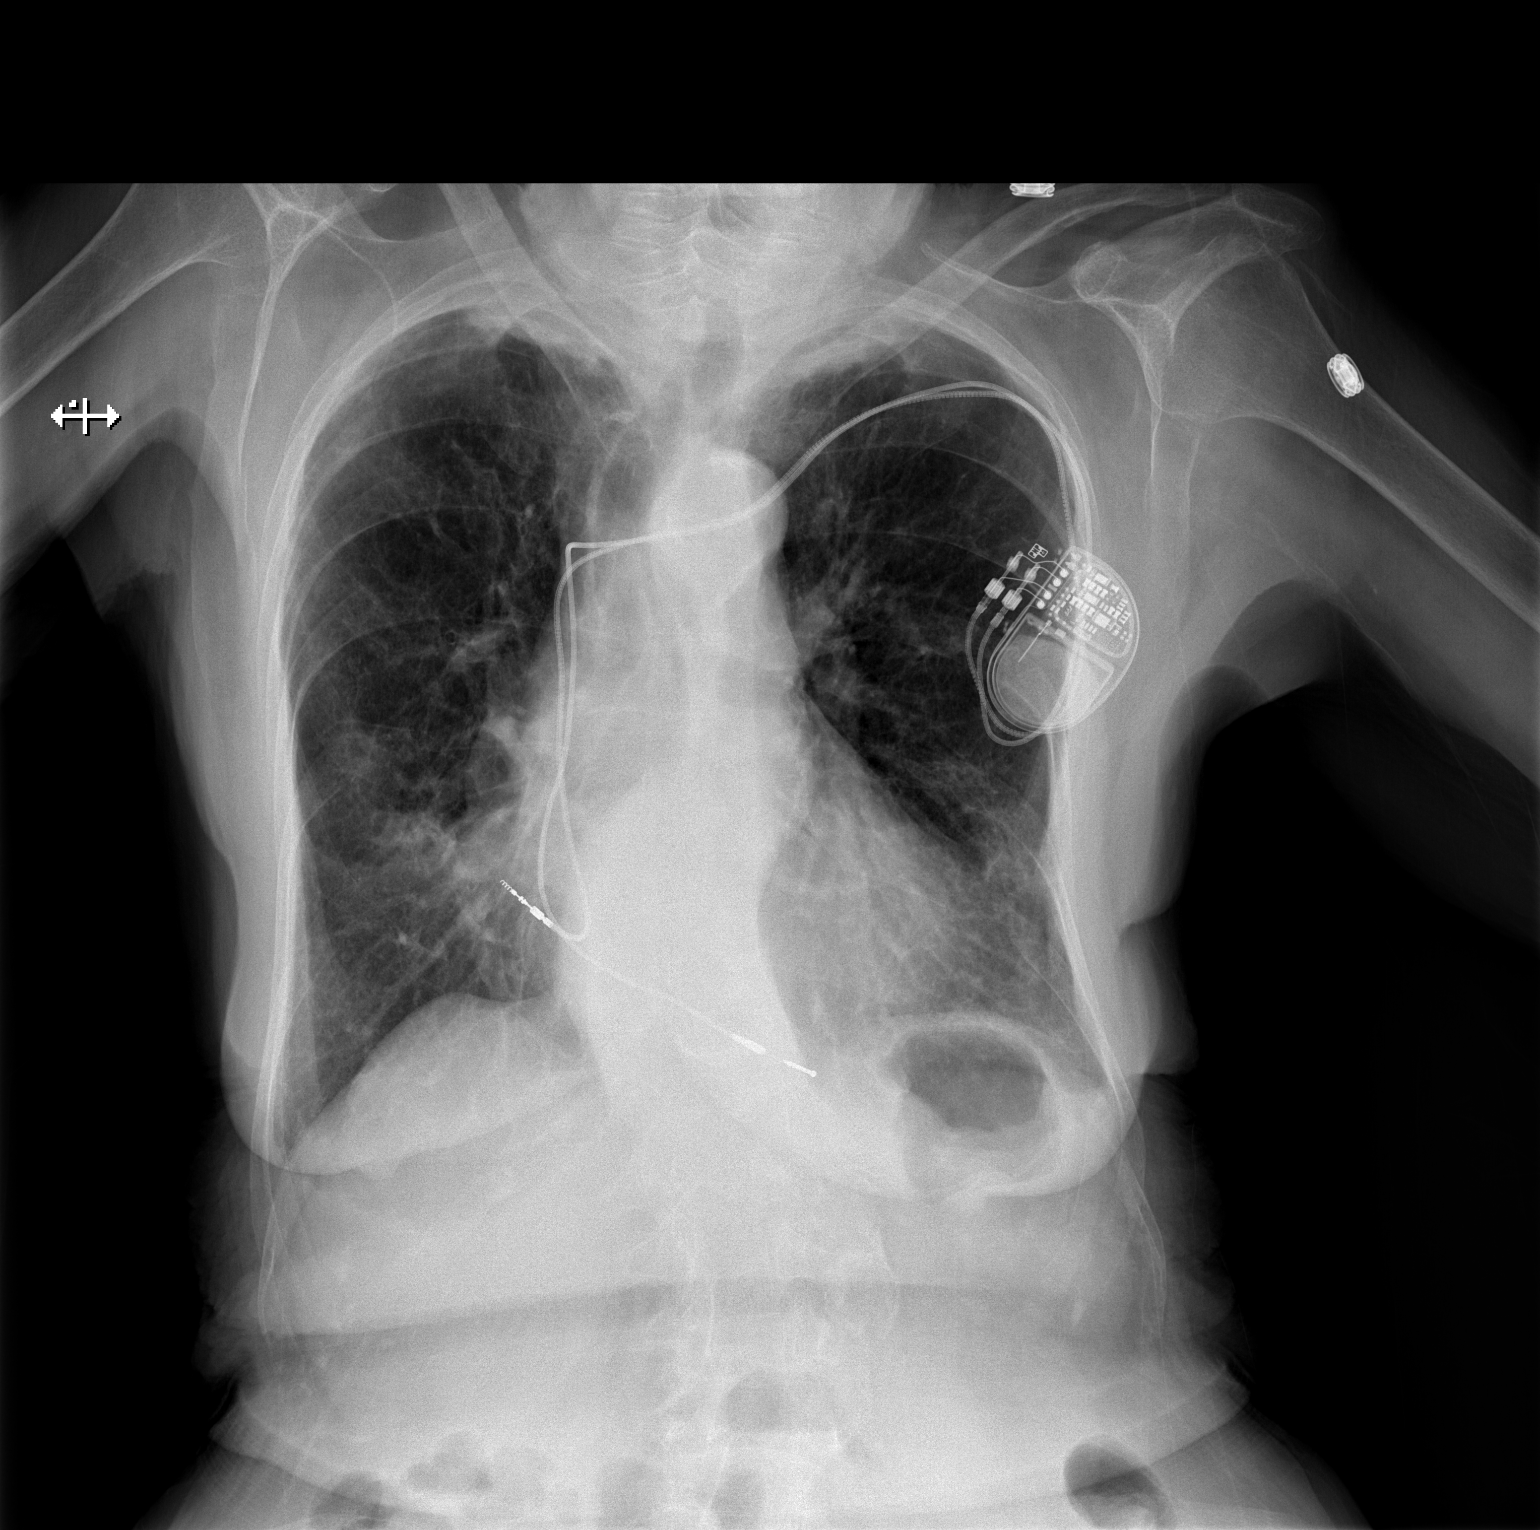

[w chest lat]
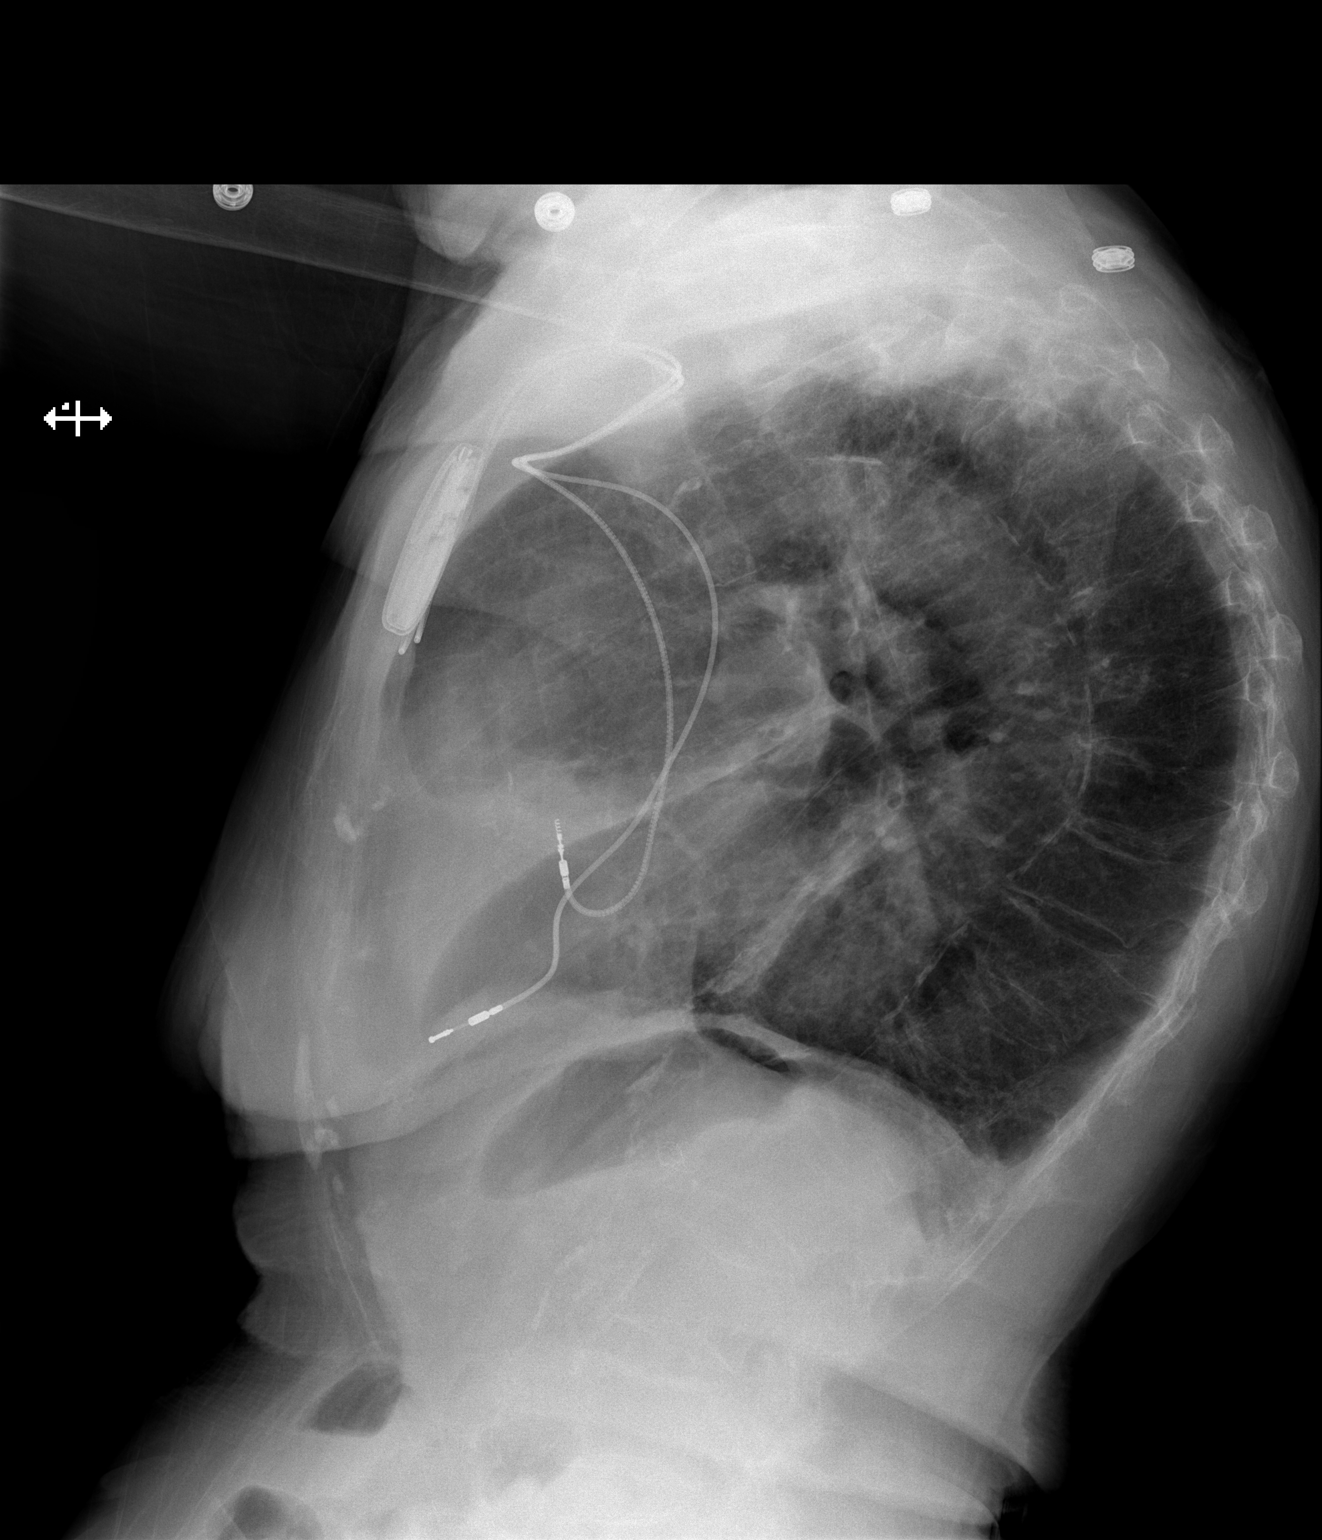

[2 of 2 positions shown; findings below may reference images not displayed]

FINDINGS: The lungs are hyperinflated. An area of ill-defined density projects
within the right middle lobe. Component of this is due to chronic
interstitial changes. There is diffuse thickening of interstitial
markings. The cardiac silhouette is enlarged. A left-sided chest
wall cardiac pacing unit is appreciated with lead tip projecting
region the right atrium right ventricle. S-shaped scoliosis
identified within the thoracolumbar spine.
IMPRESSION: COPD. Chronic interstitial changes. Atelectasis versus infiltrate,
mild in the region right lower lobe.

## 2013-12-17 IMAGING — CR DG RIBS 2V*R*
3 series · 3 of 3 positions shown · non-contrast
Comparison: None.

CLINICAL DATA: Status post fall, right lateral rib pain

EXAM:
RIGHT RIBS - 2 VIEW

[w ribs ap upper right]
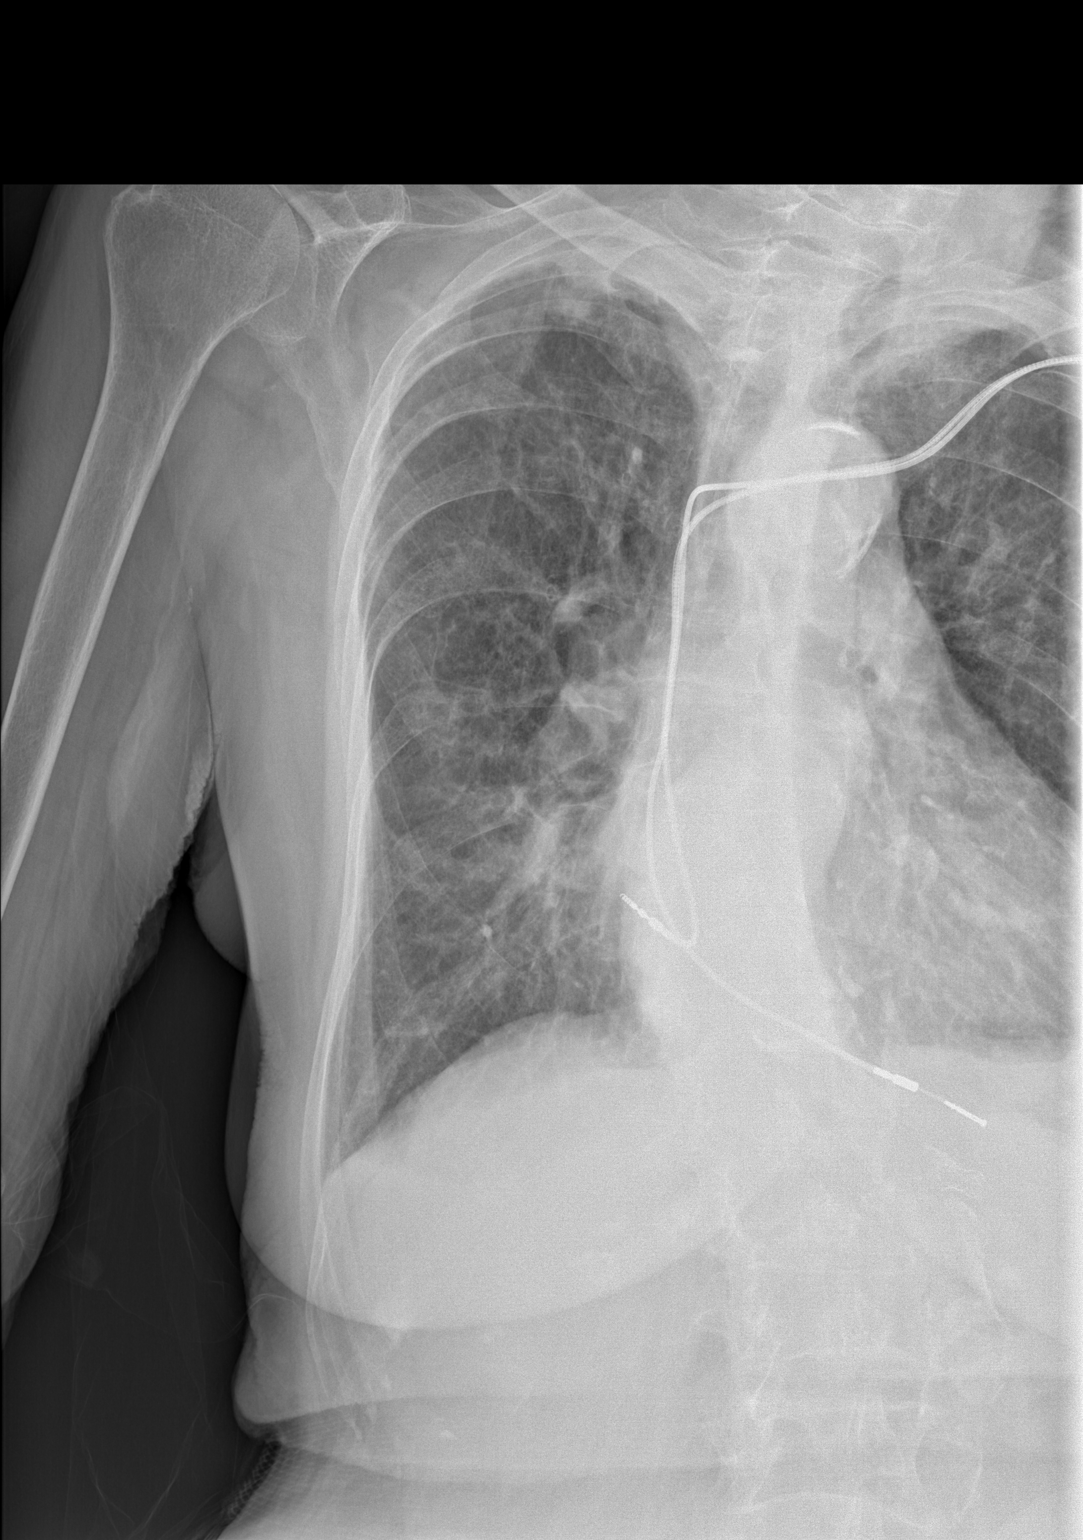

[w ribs ap lower right]
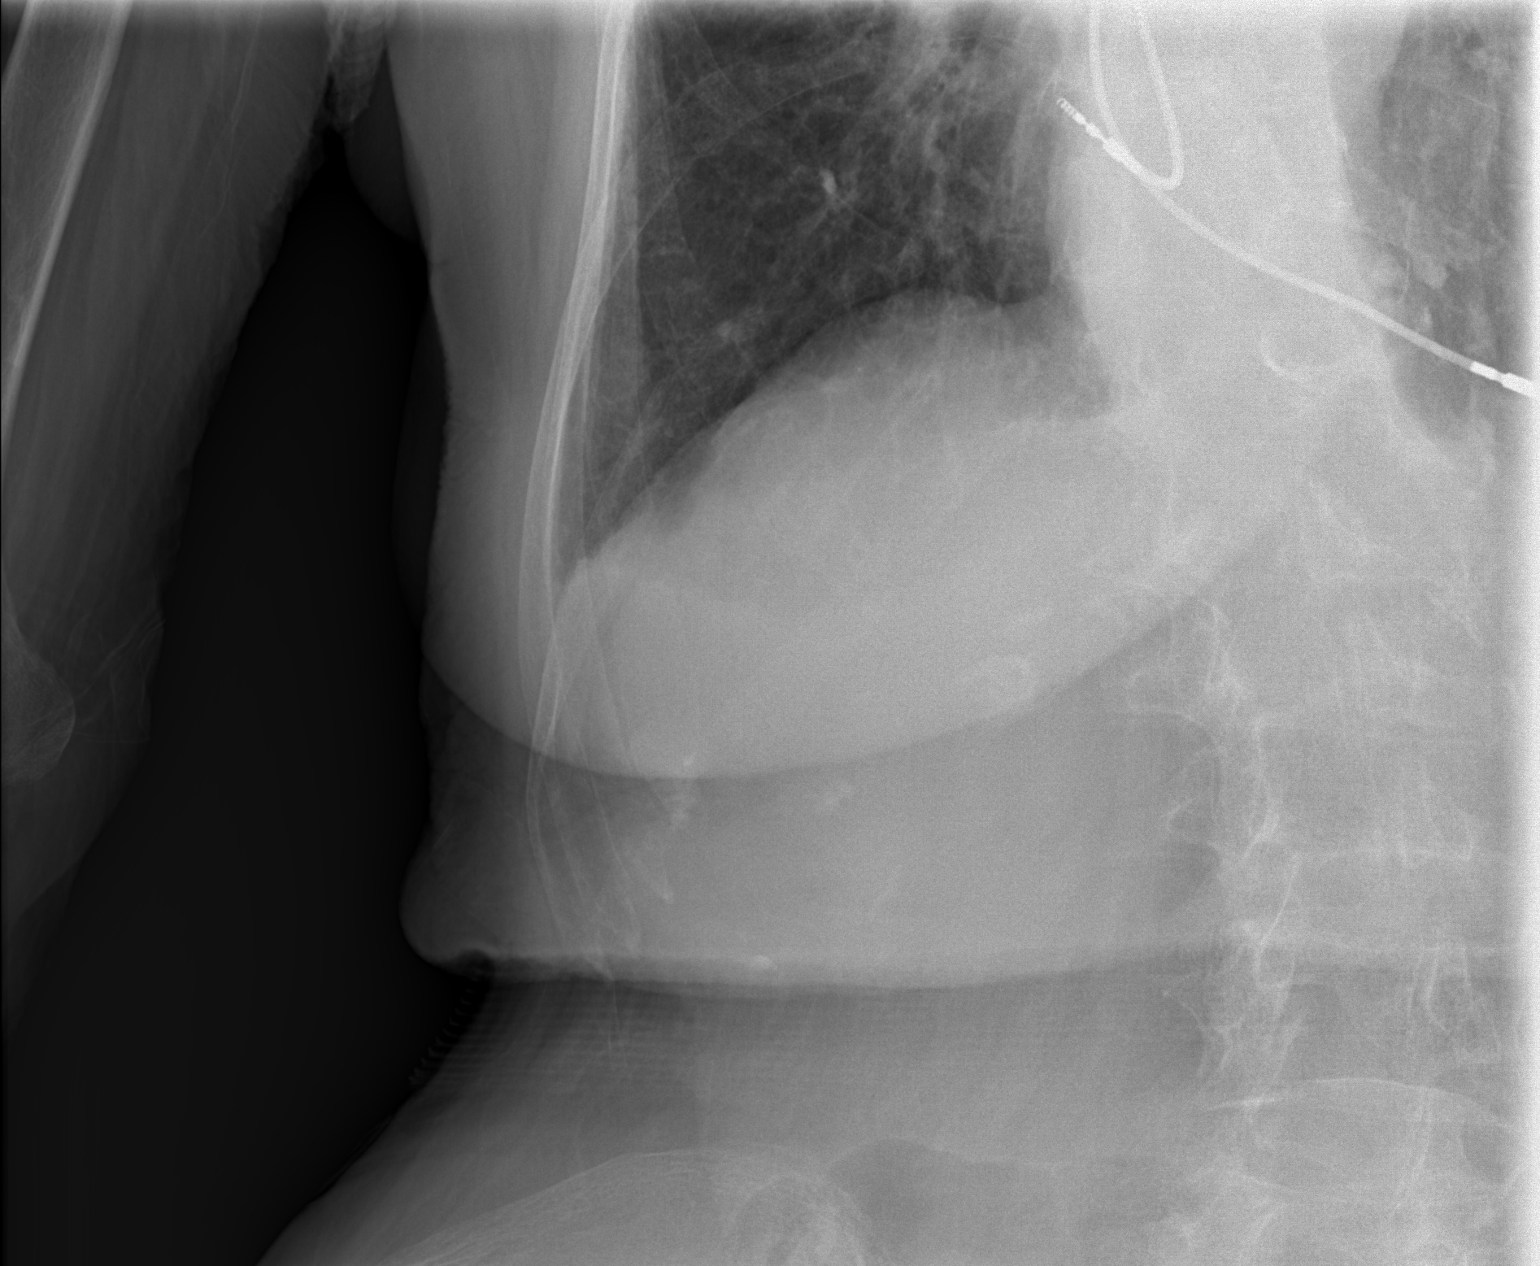

[w ribs obl right]
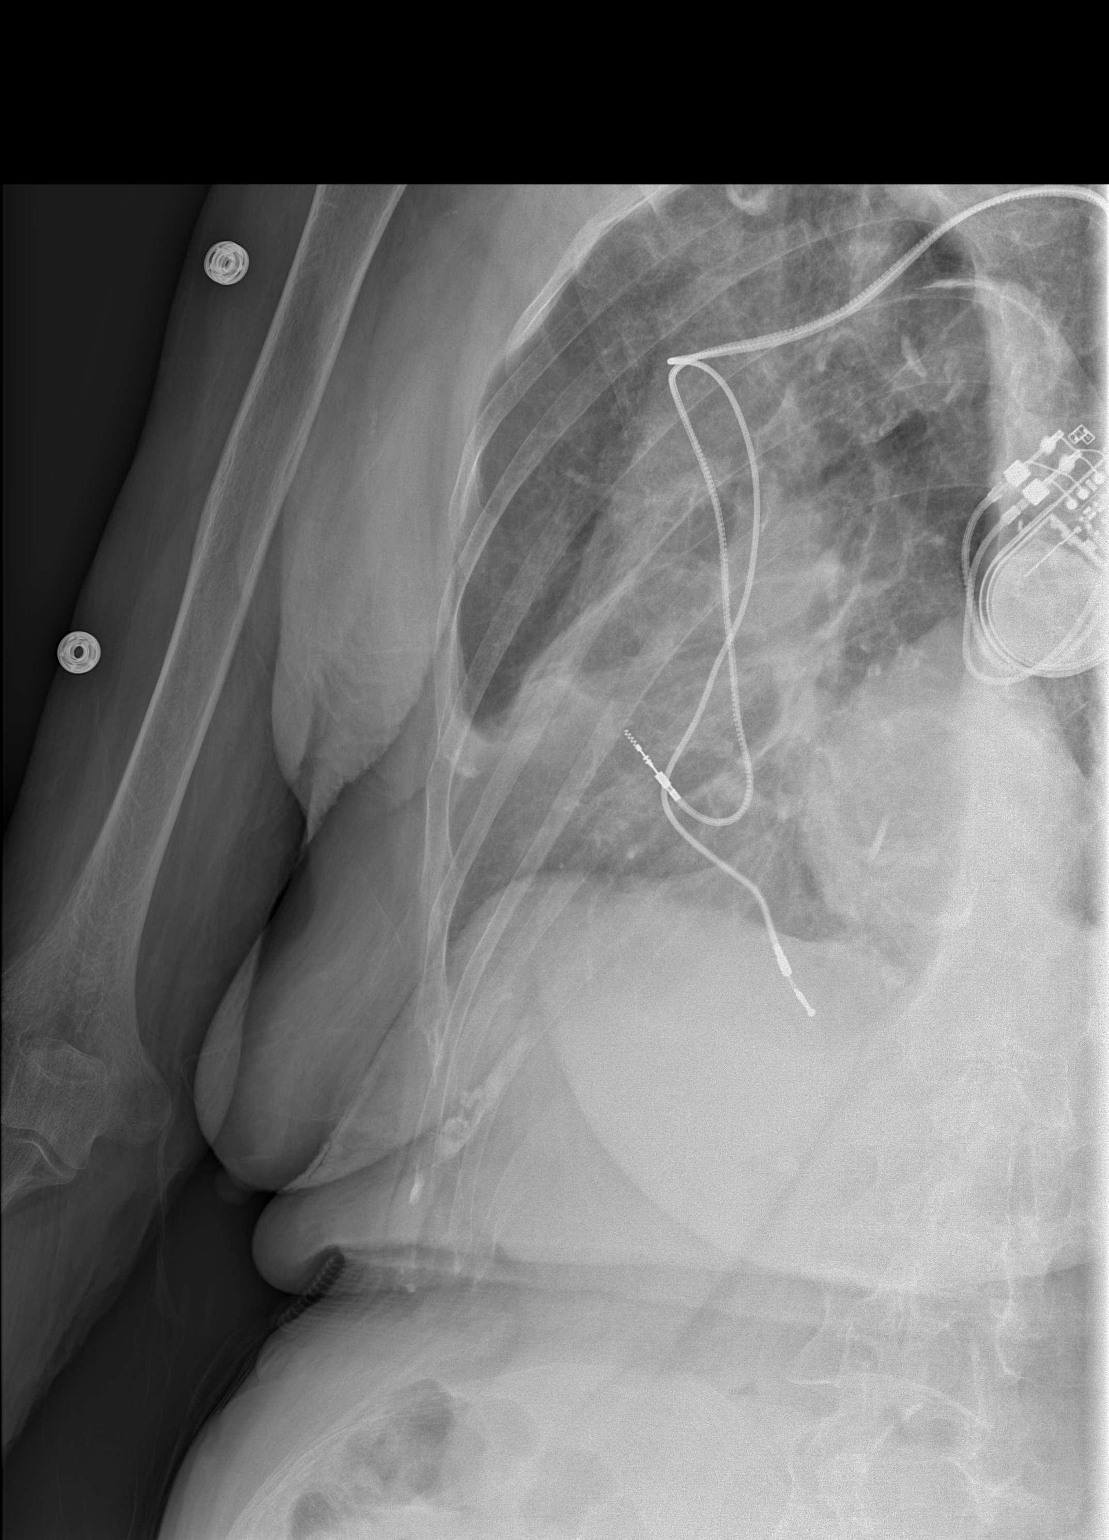

[3 of 3 positions shown; findings below may reference images not displayed]

FINDINGS: No fracture or other bone lesions are seen involving the ribs.
IMPRESSION: Negative.

## 2013-12-28 ENCOUNTER — Encounter: Payer: Medicare Other | Admitting: Internal Medicine

## 2014-01-12 ENCOUNTER — Encounter: Payer: Self-pay | Admitting: Internal Medicine

## 2014-01-12 ENCOUNTER — Ambulatory Visit (INDEPENDENT_AMBULATORY_CARE_PROVIDER_SITE_OTHER): Payer: Medicare Other | Admitting: Internal Medicine

## 2014-01-12 VITALS — BP 152/78 | HR 91 | Ht 62.0 in | Wt 132.0 lb

## 2014-01-12 DIAGNOSIS — I495 Sick sinus syndrome: Secondary | ICD-10-CM

## 2014-01-12 DIAGNOSIS — I701 Atherosclerosis of renal artery: Secondary | ICD-10-CM | POA: Diagnosis not present

## 2014-01-12 DIAGNOSIS — Z45018 Encounter for adjustment and management of other part of cardiac pacemaker: Secondary | ICD-10-CM | POA: Diagnosis not present

## 2014-01-12 DIAGNOSIS — I480 Paroxysmal atrial fibrillation: Secondary | ICD-10-CM

## 2014-01-12 LAB — MDC_IDC_ENUM_SESS_TYPE_INCLINIC
Battery Impedance: 100 Ohm
Battery Voltage: 2.8 V
Brady Statistic AP VP Percent: 0 %
Brady Statistic AS VS Percent: 38 %
Date Time Interrogation Session: 20151023164154
Lead Channel Impedance Value: 518 Ohm
Lead Channel Pacing Threshold Amplitude: 1 V
Lead Channel Pacing Threshold Pulse Width: 0.4 ms
Lead Channel Sensing Intrinsic Amplitude: 2.8 mV
Lead Channel Setting Pacing Amplitude: 2 V
Lead Channel Setting Pacing Amplitude: 2.5 V
Lead Channel Setting Sensing Sensitivity: 4 mV
MDC IDC MSMT BATTERY REMAINING LONGEVITY: 134 mo
MDC IDC MSMT LEADCHNL RA PACING THRESHOLD AMPLITUDE: 0.5 V
MDC IDC MSMT LEADCHNL RA PACING THRESHOLD PULSEWIDTH: 0.4 ms
MDC IDC MSMT LEADCHNL RV IMPEDANCE VALUE: 578 Ohm
MDC IDC MSMT LEADCHNL RV SENSING INTR AMPL: 8 mV
MDC IDC SET LEADCHNL RV PACING PULSEWIDTH: 0.4 ms
MDC IDC STAT BRADY AP VS PERCENT: 62 %
MDC IDC STAT BRADY AS VP PERCENT: 0 %

## 2014-01-12 MED ORDER — HYDROCHLOROTHIAZIDE 12.5 MG PO CAPS: 12.5000 mg | ORAL_CAPSULE | Freq: Every day | ORAL | Status: DC

## 2014-01-12 NOTE — Progress Notes (Signed)
      Patient Care Team: Marden Nobleobert Gates, MD as PCP - General (Internal Medicine) Lesleigh NoeHenry W Smith III, MD (Cardiology)   HPI  [name] LatinHelen [name] is a 80 y.o. female seens following pacemaker implanation years ago with generator replacement SK 7/15 for sinus node dysfunction  She has done well  Past Medical History  Diagnosis Date  . Mesenteric ischemia, chronic   . Chronic total occlusion of artery of the extremities   . Tachy-brady syndrome     pacemaker-placed about 6 yrs ago.  . Anemia   . Hypertension   . Paroxysmal supraventricular tachycardia   . SBO (small bowel obstruction)     history partial  . Diverticulosis   . COPD (chronic obstructive pulmonary disease)   . Hearing difficulty of left ear   . Pacemaker battery depletion     Past Surgical History  Procedure Laterality Date  . Appendectomy    . Abdominal hysterectomy    . Pacemaker insertion    . Iliac artery stent  07/30/10    Left CIA with angiogram    Current Outpatient Prescriptions  Medication Sig Dispense Refill  . amLODipine (NORVASC) 10 MG tablet Take 1 tablet (10 mg total) by mouth every morning.  30 tablet  11  . atenolol (TENORMIN) 50 MG tablet Take 50 mg by mouth every morning.       . citalopram (CELEXA) 10 MG tablet Take 10 mg by mouth daily.      . clopidogrel (PLAVIX) 75 MG tablet Take 1 tablet (75 mg total) by mouth daily.  30 tablet  11  . ferrous fumarate (HEMOCYTE - 106 MG FE) 325 (106 FE) MG TABS tablet Take 1 tablet by mouth 3 (three) times a week. Take Monday, Wednesday, Friday      . loratadine (ALLERGY) 10 MG tablet Take 10 mg by mouth as needed for allergies.      [name] Kitchen. losartan (COZAAR) 100 MG tablet Take 100 mg by mouth every morning.       [name] Kitchen. oxyCODONE-acetaminophen (PERCOCET/ROXICET) 5-325 MG per tablet Take 0.5 tablets by mouth at bedtime.       . hydrochlorothiazide (MICROZIDE) 12.5 MG capsule Take 1 capsule (12.5 mg total) by mouth daily.  90 capsule  3   No current  facility-administered medications for this visit.    No Known Allergies  Review of Systems negative except from HPI and PMH  Physical Exam BP 152/78  Pulse 91  Ht 5\' 2"  (1.575 m)  Wt 132 lb (59.875 kg)  BMI 24.14 kg/m2 Well developed and well nourished in no acute distress HENT normal E scleral and icterus clear Neck Supple JVP flat; carotids brisk and full Clear to ausculation Device pocket well healed; without hematoma or erythema.  There is no tethering Regular rate and rhythm, no murmurs gallops or rub Soft with active bowel sounds No clubbing cyanosis  Edema Alert and oriented, grossly normal motor and sensory function Skin Warm and Dry    Assessment and  Plan Sinus node dysfunction Pacemaker Medtronic  The patient's device was interrogated.  The information was reviewed. No changes were made in the programming.     she is doing well will see in 9 mionths

## 2014-01-12 NOTE — Patient Instructions (Signed)
Your physician has recommended you make the following change in your medication: START:  HCTZ 12.5 MG DAILY  Your physician wants you to follow-up in: 9 months with Dr. Logan BoresKlein  You will receive a reminder letter in the mail two months in advance. If you don't receive a letter, please call our office to schedule the follow-up appointment.

## 2014-01-22 DIAGNOSIS — D81818 Other biotin-dependent carboxylase deficiency: Secondary | ICD-10-CM | POA: Diagnosis not present

## 2014-01-22 DIAGNOSIS — N183 Chronic kidney disease, stage 3 (moderate): Secondary | ICD-10-CM | POA: Diagnosis not present

## 2014-01-22 DIAGNOSIS — I700 Atherosclerosis of aorta: Secondary | ICD-10-CM | POA: Diagnosis not present

## 2014-01-22 DIAGNOSIS — E559 Vitamin D deficiency, unspecified: Secondary | ICD-10-CM | POA: Diagnosis not present

## 2014-01-22 DIAGNOSIS — D509 Iron deficiency anemia, unspecified: Secondary | ICD-10-CM | POA: Diagnosis not present

## 2014-01-22 DIAGNOSIS — Z950 Presence of cardiac pacemaker: Secondary | ICD-10-CM | POA: Diagnosis not present

## 2014-01-22 DIAGNOSIS — R32 Unspecified urinary incontinence: Secondary | ICD-10-CM | POA: Diagnosis not present

## 2014-01-22 DIAGNOSIS — I482 Chronic atrial fibrillation: Secondary | ICD-10-CM | POA: Diagnosis not present

## 2014-01-22 DIAGNOSIS — I739 Peripheral vascular disease, unspecified: Secondary | ICD-10-CM | POA: Diagnosis not present

## 2014-01-22 DIAGNOSIS — J449 Chronic obstructive pulmonary disease, unspecified: Secondary | ICD-10-CM | POA: Diagnosis not present

## 2014-01-22 DIAGNOSIS — I10 Essential (primary) hypertension: Secondary | ICD-10-CM | POA: Diagnosis not present

## 2014-03-01 ENCOUNTER — Encounter (HOSPITAL_COMMUNITY): Payer: Self-pay | Admitting: Internal Medicine

## 2014-04-12 ENCOUNTER — Other Ambulatory Visit: Payer: Self-pay | Admitting: *Deleted

## 2014-04-12 DIAGNOSIS — I739 Peripheral vascular disease, unspecified: Secondary | ICD-10-CM

## 2014-04-12 MED ORDER — CLOPIDOGREL BISULFATE 75 MG PO TABS: 75.0000 mg | ORAL_TABLET | Freq: Every day | ORAL | Status: AC

## 2014-04-16 ENCOUNTER — Telehealth: Payer: Self-pay | Admitting: Cardiology

## 2014-04-16 ENCOUNTER — Ambulatory Visit (INDEPENDENT_AMBULATORY_CARE_PROVIDER_SITE_OTHER): Payer: Medicare Other | Admitting: *Deleted

## 2014-04-16 NOTE — Telephone Encounter (Signed)
Confirmed remote transmission 

## 2014-04-17 ENCOUNTER — Encounter: Payer: Self-pay | Admitting: Cardiology

## 2014-04-23 ENCOUNTER — Other Ambulatory Visit: Payer: Self-pay | Admitting: Interventional Cardiology

## 2014-04-23 DIAGNOSIS — I495 Sick sinus syndrome: Secondary | ICD-10-CM

## 2014-04-23 NOTE — Progress Notes (Signed)
Remote pacemaker transmission.   

## 2014-04-24 LAB — MDC_IDC_ENUM_SESS_TYPE_REMOTE
Battery Impedance: 100 Ohm
Brady Statistic AP VP Percent: 0 %
Brady Statistic AP VS Percent: 56 %
Brady Statistic AS VP Percent: 0 %
Lead Channel Impedance Value: 535 Ohm
Lead Channel Impedance Value: 622 Ohm
Lead Channel Pacing Threshold Amplitude: 0.5 V
Lead Channel Sensing Intrinsic Amplitude: 1.4 mV
Lead Channel Setting Pacing Amplitude: 2 V
Lead Channel Setting Pacing Amplitude: 2.5 V
Lead Channel Setting Pacing Pulse Width: 0.4 ms
Lead Channel Setting Sensing Sensitivity: 4 mV
MDC IDC MSMT BATTERY REMAINING LONGEVITY: 136 mo
MDC IDC MSMT BATTERY VOLTAGE: 2.8 V
MDC IDC MSMT LEADCHNL RA PACING THRESHOLD PULSEWIDTH: 0.4 ms
MDC IDC MSMT LEADCHNL RV PACING THRESHOLD AMPLITUDE: 0.75 V
MDC IDC MSMT LEADCHNL RV PACING THRESHOLD PULSEWIDTH: 0.4 ms
MDC IDC MSMT LEADCHNL RV SENSING INTR AMPL: 8 mV
MDC IDC SESS DTM: 20160201163139
MDC IDC STAT BRADY AS VS PERCENT: 44 %

## 2014-04-25 NOTE — Telephone Encounter (Signed)
Medication Detail      Disp Refills Start End     amLODipine (NORVASC) 10 MG tablet 30 tablet 11 10/19/2013     Sig - Route: Take 1 tablet (10 mg total) by mouth every morning. - Oral    E-Prescribing Status: Receipt confirmed by pharmacy (10/19/2013 10:09 AM EDT)     Associated Diagnoses    Essential hypertension       Pharmacy    [id number] - [place], [place] - [address]

## 2014-05-01 ENCOUNTER — Encounter: Payer: Self-pay | Admitting: Cardiology

## 2014-05-04 ENCOUNTER — Encounter: Payer: Self-pay | Admitting: Internal Medicine

## 2014-06-01 ENCOUNTER — Other Ambulatory Visit (HOSPITAL_COMMUNITY): Payer: Medicare Other

## 2014-06-01 ENCOUNTER — Encounter (HOSPITAL_COMMUNITY): Payer: Medicare Other

## 2014-06-01 ENCOUNTER — Ambulatory Visit: Payer: Medicare Other | Admitting: Vascular Surgery

## 2014-06-27 ENCOUNTER — Encounter (HOSPITAL_COMMUNITY): Payer: Medicare Other

## 2014-06-27 ENCOUNTER — Other Ambulatory Visit (HOSPITAL_COMMUNITY): Payer: Medicare Other

## 2014-06-28 ENCOUNTER — Other Ambulatory Visit: Payer: Self-pay | Admitting: Interventional Cardiology

## 2014-06-28 ENCOUNTER — Encounter: Payer: Self-pay | Admitting: Vascular Surgery

## 2014-06-29 ENCOUNTER — Ambulatory Visit (INDEPENDENT_AMBULATORY_CARE_PROVIDER_SITE_OTHER): Payer: Medicare Other | Admitting: Vascular Surgery

## 2014-06-29 ENCOUNTER — Telehealth: Payer: Self-pay | Admitting: Vascular Surgery

## 2014-06-29 ENCOUNTER — Other Ambulatory Visit (HOSPITAL_COMMUNITY): Payer: Medicare Other

## 2014-06-29 ENCOUNTER — Ambulatory Visit: Payer: Medicare Other | Admitting: Vascular Surgery

## 2014-06-29 ENCOUNTER — Encounter (HOSPITAL_COMMUNITY): Payer: Medicare Other

## 2014-06-29 ENCOUNTER — Ambulatory Visit (INDEPENDENT_AMBULATORY_CARE_PROVIDER_SITE_OTHER)
Admission: RE | Admit: 2014-06-29 | Discharge: 2014-06-29 | Disposition: A | Discharge status: Home / Self Care | Payer: Medicare Other | Source: Ambulatory Visit | Attending: Vascular Surgery | Admitting: Vascular Surgery

## 2014-06-29 ENCOUNTER — Ambulatory Visit (HOSPITAL_COMMUNITY)
Admission: RE | Admit: 2014-06-29 | Discharge: 2014-06-29 | Disposition: A | Discharge status: Home / Self Care | Payer: Medicare Other | Source: Ambulatory Visit | Attending: Vascular Surgery | Admitting: Vascular Surgery

## 2014-06-29 ENCOUNTER — Encounter: Payer: Self-pay | Admitting: Vascular Surgery

## 2014-06-29 ENCOUNTER — Other Ambulatory Visit: Payer: Self-pay | Admitting: Vascular Surgery

## 2014-06-29 VITALS — BP 141/99 | HR 80 | Ht 62.0 in | Wt 127.5 lb

## 2014-06-29 DIAGNOSIS — I70219 Atherosclerosis of native arteries of extremities with intermittent claudication, unspecified extremity: Secondary | ICD-10-CM

## 2014-06-29 DIAGNOSIS — I701 Atherosclerosis of renal artery: Secondary | ICD-10-CM

## 2014-06-29 DIAGNOSIS — K559 Vascular disorder of intestine, unspecified: Secondary | ICD-10-CM

## 2014-06-29 DIAGNOSIS — K551 Chronic vascular disorders of intestine: Secondary | ICD-10-CM | POA: Insufficient documentation

## 2014-06-29 NOTE — Telephone Encounter (Signed)
Spoke with pt's daughter Lucien Mons- [facility] wound care appt 07/17/14 2:30 pm. She verbalized understanding.

## 2014-06-29 NOTE — Progress Notes (Signed)
Established Chronic Mesenteric Ischemia, Intermittent Claudication, and Renal Artery Stenosis    History of Present Illness  [name] [name] is a 81 y.o. female  who presents with chief complaint: routine follow-up. Patient is s/p L CIA stenting, and L RA stenting. Her CMI sx are unchanged: Pt has had no abdominal pain. She denies food fear. She denies post-prandial pain. Her dietary habits are unchanged: snacking, enjoys fudge brownies. She is able to ambulate enough to complete her ADL but has more episodes of falls recently.  She denies any foot wounds, ulcer, or drainage. Her BP control has been poor recently, felt due to family related issues.  She denies any changes in her urinary habits.   Patient PMH, PSH, SH, FamHx, Medications, Allergies, and ROS are unchanged from previous visit on 05/26/13  On ROS: no melena or hematochezia or weight loss  Physical Examination   Filed Vitals:   06/29/14 1209 06/29/14 1214  BP: 192/91 141/99  Pulse: 80   Height:  (1.575 m)   Weight: 127 lb 8 oz (57.834 kg)   SpO2: 98%    Body mass index is 23.31 kg/(m^2).   General: A&O x 3, WDWN, elderly and cachectic   Pulmonary: Sym exp, good air movt, CTAB, no rales, rhonchi, & wheezing   Cardiac: RRR, Nl S1, S2, no Murmurs, rubs or gallops   Vascular:  Vessel  Right  Left   Radial  Palpable  Palpable   Brachial  Palpable  Palpable   Carotid  Palpable, without bruit  Palpable, without bruit   Aorta  Non-palpable  N/A   Femoral  Palpable  Palpable   Popliteal  Non-palpable  Non-palpable   PT  Non-Palpable  Non-Palpable   DP  Palpable  Not palpable   Gastrointestinal: soft, NTND, -G/R, - HSM, - masses, - CVAT B   Musculoskeletal: M/S 5/5 throughout , Extremities without ischemic changes , no ulcers, no cyanotic toes  Neurologic: Pain and light touch intact in extremities , Motor exam as listed above   Non-Invasive Vascular Imaging  ABI (Date:  06/29/2014)  R: 0.78 (0.86), DP: bi, PT: mono, TBI: 0.73  L: 0.63 (0.66), DP: mono, PT: absent, TBI: 0.34   B Renovascular Duplex (Date: 05/26/2013)   R kidney: 7.3 cm, perfusion visualized via collaterals  L kidney: 10.3 cm, stent NV but velocities normal   SMA: >70% stenosis suggested  CA: no visualized  AORTOILIAC Duplex (Date: 05/26/2013)  Patent L CIA stent  Ao: 83 c/s   PSV in-stent: 202 c/s (<3.0)  Medical Decision Making  [name] [name] is a 81 y.o. female  who presents with: stable asx chronic mesenteric ischemia with known SMA and CA occlusion, collateral flow via IMA to SMA, BLE PAD with patent L CIA stent, and patent L RA stent with likely occluded R RA.    Based on her exam and studies, I have offered the patient surveillance studies including: BLE ABI, Aortoiliac duplex, Mesenteric duplex, and B renal artery duplex in the next 12 months.   As we discussed previously, we will avoid any significant operations in this patient due to her cardiac risks > benefit.   She will continue with her Plavix use and we discussed previously.   I discussed in depth with the patient the nature of atherosclerosis, and emphasized the importance of maximal medical management including strict control of blood pressure, blood glucose, and lipid levels, obtaining regular exercise, and cessation of smoking. The patient is aware that without  maximal medical management the underlying atherosclerotic disease process will progress, limiting the benefit of any interventions.   I emphasize the importance of routinely taking her medications to avoid acute complications and also to avoid long-term sequelae of chronic disease.   Thank you for allowing us to participate in this patient's care.   Leonides SakeBrian Zorianna Taliaferro, MD [facility] [place] Office: [phone] Pager: [phone]  06/29/2014, 3:03 PM

## 2014-07-02 ENCOUNTER — Other Ambulatory Visit: Payer: Self-pay | Admitting: *Deleted

## 2014-07-02 DIAGNOSIS — I739 Peripheral vascular disease, unspecified: Secondary | ICD-10-CM

## 2014-07-02 DIAGNOSIS — K551 Chronic vascular disorders of intestine: Secondary | ICD-10-CM

## 2014-07-02 DIAGNOSIS — I701 Atherosclerosis of renal artery: Secondary | ICD-10-CM

## 2014-07-13 ENCOUNTER — Ambulatory Visit: Payer: Medicare Other | Admitting: Vascular Surgery

## 2014-07-16 ENCOUNTER — Ambulatory Visit: Payer: Medicare Other | Admitting: Vascular Surgery

## 2014-07-16 ENCOUNTER — Other Ambulatory Visit (HOSPITAL_COMMUNITY): Payer: Medicare Other

## 2014-07-16 ENCOUNTER — Encounter (HOSPITAL_COMMUNITY): Payer: Medicare Other

## 2014-07-17 ENCOUNTER — Encounter (HOSPITAL_BASED_OUTPATIENT_CLINIC_OR_DEPARTMENT_OTHER): Payer: No Typology Code available for payment source

## 2014-09-02 ENCOUNTER — Emergency Department (HOSPITAL_BASED_OUTPATIENT_CLINIC_OR_DEPARTMENT_OTHER)
Admission: EM | Admit: 2014-09-02 | Discharge: 2014-09-02 | Disposition: A | Discharge status: Home / Self Care | Payer: Medicare Other | Attending: Emergency Medicine | Admitting: Emergency Medicine

## 2014-09-02 ENCOUNTER — Encounter (HOSPITAL_BASED_OUTPATIENT_CLINIC_OR_DEPARTMENT_OTHER): Payer: Self-pay | Admitting: Emergency Medicine

## 2014-09-02 DIAGNOSIS — J449 Chronic obstructive pulmonary disease, unspecified: Secondary | ICD-10-CM | POA: Diagnosis not present

## 2014-09-02 DIAGNOSIS — Z79899 Other long term (current) drug therapy: Secondary | ICD-10-CM | POA: Diagnosis not present

## 2014-09-02 DIAGNOSIS — Z7902 Long term (current) use of antithrombotics/antiplatelets: Secondary | ICD-10-CM | POA: Diagnosis not present

## 2014-09-02 DIAGNOSIS — Z8719 Personal history of other diseases of the digestive system: Secondary | ICD-10-CM | POA: Diagnosis not present

## 2014-09-02 DIAGNOSIS — H6123 Impacted cerumen, bilateral: Secondary | ICD-10-CM | POA: Diagnosis not present

## 2014-09-02 DIAGNOSIS — I129 Hypertensive chronic kidney disease with stage 1 through stage 4 chronic kidney disease, or unspecified chronic kidney disease: Secondary | ICD-10-CM | POA: Insufficient documentation

## 2014-09-02 DIAGNOSIS — D649 Anemia, unspecified: Secondary | ICD-10-CM | POA: Insufficient documentation

## 2014-09-02 DIAGNOSIS — H9203 Otalgia, bilateral: Secondary | ICD-10-CM | POA: Diagnosis present

## 2014-09-02 DIAGNOSIS — N189 Chronic kidney disease, unspecified: Secondary | ICD-10-CM | POA: Insufficient documentation

## 2014-09-02 DIAGNOSIS — Z45010 Encounter for checking and testing of cardiac pacemaker pulse generator [battery]: Secondary | ICD-10-CM | POA: Diagnosis not present

## 2014-09-02 DIAGNOSIS — Z87891 Personal history of nicotine dependence: Secondary | ICD-10-CM | POA: Insufficient documentation

## 2014-09-02 NOTE — ED Provider Notes (Signed)
CSN: [id number]     Arrival date & time 09/02/14  1847 History   First MD Initiated Contact with Patient 09/02/14 1853     Chief Complaint  Patient presents with  . Otalgia     (Consider location/radiation/quality/duration/timing/severity/associated sxs/prior Treatment) HPI   [name] [name] is a 81 y.o. female complaining of severe left-sided otalgia which woke her from sleep at 4 AM this morning. Patient denies fever, chills, nausea, vomiting, rhinorrhea above her baseline (has chronic allergies), cough, decreased hearing acuity (daughter states that her hearing has deteriorated significantly in the last year), tinnitus, ataxia or difficulty ambulating. Patient's daughter who is her primary caregiver gave her to leave this afternoon and the pain has completely resolved.  Past Medical History  Diagnosis Date  . Mesenteric ischemia, chronic   . Chronic total occlusion of artery of the extremities   . Tachy-brady syndrome     pacemaker-placed about 6 yrs ago.  . Anemia   . Hypertension   . Paroxysmal supraventricular tachycardia   . SBO (small bowel obstruction)     history partial  . Diverticulosis   . COPD (chronic obstructive pulmonary disease)   . Hearing difficulty of left ear   . Pacemaker battery depletion   . Chronic kidney disease    Past Surgical History  Procedure Laterality Date  . Appendectomy    . Abdominal hysterectomy    . Pacemaker insertion    . Iliac artery stent  07/30/10    Left CIA with angiogram  . Pacemaker generator change N/A 09/18/2013    Procedure: PACEMAKER GENERATOR CHANGE;  Surgeon: Duke Salvia, MD;  Location: [facility] CATH LAB;  Service: Cardiovascular;  Laterality: N/A;   Family History  Problem Relation Age of Onset  . Cancer Brother   . Diabetes Brother   . Heart disease Brother   . Hyperlipidemia Brother    History  Substance Use Topics  . Smoking status: Former Smoker -- 1.00 packs/day for 65 years    Types: Cigarettes    Quit date:  06/22/2010  . Smokeless tobacco: Never Used  . Alcohol Use: No   OB History    No data available     Review of Systems  10 systems reviewed and found to be negative, except as noted in the HPI.   Allergies  Review of patient's allergies indicates no known allergies.  Home Medications   Prior to Admission medications   Medication Sig Start Date End Date Taking? Authorizing Provider  amLODipine (NORVASC) 10 MG tablet Take 1 tablet (10 mg total) by mouth every morning. 10/19/13   Lyn Records, MD  atenolol (TENORMIN) 50 MG tablet Take 50 mg by mouth every morning.     Historical Provider, MD  Calcium Citrate-Vitamin D (CALCIUM CITRATE + D3 PO) Take by mouth.    Historical Provider, MD  citalopram (CELEXA) 10 MG tablet Take 10 mg by mouth daily.    Historical Provider, MD  clopidogrel (PLAVIX) 75 MG tablet Take 1 tablet (75 mg total) by mouth daily. 04/12/14   Fransisco Hertz, MD  [employer] IRON 325 (65 FE) MG tablet  05/26/14   Historical Provider, MD  Cyanocobalamin (B-12 PO) Take by mouth.    Historical Provider, MD  ferrous fumarate (HEMOCYTE - 106 MG FE) 325 (106 FE) MG TABS tablet Take 1 tablet by mouth 3 (three) times a week. Take Monday, Wednesday, Friday    Historical Provider, MD  hydrochlorothiazide (MICROZIDE) 12.5 MG capsule Take 1 capsule (12.5 mg  total) by mouth daily. 01/12/14   Duke Salvia, MD  ipratropium (ATROVENT) 0.03 % nasal spray  04/23/14   Historical Provider, MD  loratadine (ALLERGY) 10 MG tablet Take 10 mg by mouth as needed for allergies.    Historical Provider, MD  losartan (COZAAR) 100 MG tablet Take 100 mg by mouth every morning.     Historical Provider, MD  oxyCODONE-acetaminophen (PERCOCET/ROXICET) 5-325 MG per tablet Take 0.5 tablets by mouth at bedtime.  08/07/13   Historical Provider, MD   BP 143/60 mmHg  Pulse 92  Temp(Src) 98.6 F (37 C) (Oral)  Resp 18  Ht 5\' 3"  (1.6 m)  Wt 127 lb (57.607 kg)  BMI 22.50 kg/m2  SpO2 96% Physical Exam  Constitutional:  She is oriented to person, place, and time. She appears well-developed and well-nourished. No distress.  HENT:  Head: Normocephalic and atraumatic.  Mouth/Throat: Oropharynx is clear and moist.  Bilateral cerumen impaction worse on the left than the right.  Eyes: Conjunctivae and EOM are normal. Pupils are equal, round, and reactive to light.  Neck: Normal range of motion.  Cardiovascular: Normal rate, regular rhythm and intact distal pulses.   Pulmonary/Chest: Effort normal and breath sounds normal. No stridor.  Abdominal: Soft. There is no tenderness.  Musculoskeletal: Normal range of motion.  Neurological: She is alert and oriented to person, place, and time.  Skin: She is not diaphoretic.  Psychiatric: She has a normal mood and affect.  Nursing note and vitals reviewed.   ED Course  EAR CERUMEN REMOVAL Date/Time: 09/02/2014 7:23 PM Performed by: Wynetta Emery Authorized by: Wynetta Emery Consent: Verbal consent obtained. Risks and benefits: risks, benefits and alternatives were discussed Consent given by: patient Required items: required blood products, implants, devices, and special equipment available Patient identity confirmed: verbally with patient Local anesthetic: none Location details: left ear Procedure type: curette Patient sedated: no Patient tolerance: Patient tolerated the procedure well with no immediate complications Comments: Bilateral tympanic membranes are visualized after cerumen removal, normal architecture good light reflex, no signs of infection in the inner outer ear.   (including critical care time) Labs Review Labs Reviewed - No data to display  Imaging Review No results found.   EKG Interpretation None      MDM   Final diagnoses:  Cerumen impaction, bilateral    Filed Vitals:   09/02/14 1853  BP: 143/60  Pulse: 92  Temp: 98.6 F (37 C)  TempSrc: Oral  Resp: 18  Height: 5\' 3"  (1.6 m)  Weight: 127 lb (57.607 kg)  SpO2:  96%     [name] [name] is a pleasant 81 y.o. female presenting with left-sided ear pain which woke her from sleep at 4 AM. Patient has bilateral cerumen impactions, erect is removed with curet bilaterally, there is no sign of ear infection. Physical exam is otherwise normal. Audiology referral for chronic hearing loss given.  Discussed case with attending MD who agrees with plan and stability to d/c to home.   Evaluation does not show pathology that would require ongoing emergent intervention or inpatient treatment. Pt is hemodynamically stable and mentating appropriately. Discussed findings and plan with patient/guardian, who agrees with care plan. All questions answered. Return precautions discussed and outpatient follow up given.       Wynetta Emery, PA-C 09/02/14 1924  Nelva Nay, MD 09/02/14 1925

## 2014-09-02 NOTE — Discharge Instructions (Signed)
Use Debrox once a month for 3 days to soften ceruman and prevent future impactions. NEVER use a Q tip in the ear canal ° °Please follow with your primary care doctor in the next 2 days for a check-up. They must obtain records for further management.  ° °Do not hesitate to return to the Emergency Department for any new, worsening or concerning symptoms.  ° ° °Cerumen Impaction °A cerumen impaction is when the wax in your ear forms a plug. This plug usually causes reduced hearing. Sometimes it also causes an earache or dizziness. Removing a cerumen impaction can be difficult and painful. The wax sticks to the ear canal. The canal is sensitive and bleeds easily. If you try to remove a heavy wax buildup with a cotton tipped swab, you may push it in further. °Irrigation with water, suction, and small ear curettes may be used to clear out the wax. If the impaction is fixed to the skin in the ear canal, ear drops may be needed for a few days to loosen the wax. People who build up a lot of wax frequently can use ear wax removal products available in your local drugstore. °SEEK MEDICAL CARE IF:  °You develop an earache, increased hearing loss, or marked dizziness. °Document Released: 04/16/2004 Document Revised: 06/01/2011 Document Reviewed: 06/06/2009 °ExitCare® Patient Information ©2015 [employer]. This information is not intended to replace advice given to you by your health care provider. Make sure you discuss any questions you have with your health care provider. ° ° °

## 2014-09-02 NOTE — ED Notes (Signed)
Pt woke up at 0400 this am with ear pain. No dizziness or ambulation issues that are abnormal for pt noted by family.

## 2014-09-23 ENCOUNTER — Other Ambulatory Visit: Payer: Self-pay | Admitting: Interventional Cardiology

## 2014-12-09 NOTE — Progress Notes (Signed)
No show

## 2014-12-10 ENCOUNTER — Encounter: Payer: No Typology Code available for payment source | Admitting: Internal Medicine

## 2014-12-10 ENCOUNTER — Encounter: Payer: Self-pay | Admitting: Interventional Cardiology

## 2014-12-10 ENCOUNTER — Ambulatory Visit (INDEPENDENT_AMBULATORY_CARE_PROVIDER_SITE_OTHER): Payer: Self-pay | Admitting: Interventional Cardiology

## 2014-12-10 DIAGNOSIS — I779 Disorder of arteries and arterioles, unspecified: Secondary | ICD-10-CM

## 2014-12-10 DIAGNOSIS — Z950 Presence of cardiac pacemaker: Secondary | ICD-10-CM

## 2014-12-10 DIAGNOSIS — I495 Sick sinus syndrome: Secondary | ICD-10-CM

## 2014-12-10 DIAGNOSIS — I10 Essential (primary) hypertension: Secondary | ICD-10-CM

## 2014-12-10 DIAGNOSIS — I701 Atherosclerosis of renal artery: Secondary | ICD-10-CM

## 2014-12-10 DIAGNOSIS — I5032 Chronic diastolic (congestive) heart failure: Secondary | ICD-10-CM

## 2014-12-10 DIAGNOSIS — I70219 Atherosclerosis of native arteries of extremities with intermittent claudication, unspecified extremity: Secondary | ICD-10-CM

## 2014-12-10 DIAGNOSIS — I480 Paroxysmal atrial fibrillation: Secondary | ICD-10-CM

## 2014-12-11 ENCOUNTER — Encounter: Payer: Self-pay | Admitting: Internal Medicine

## 2014-12-30 ENCOUNTER — Other Ambulatory Visit: Payer: Self-pay | Admitting: Internal Medicine

## 2015-01-02 ENCOUNTER — Encounter: Payer: Self-pay | Admitting: Interventional Cardiology

## 2015-03-22 ENCOUNTER — Encounter: Payer: Self-pay | Admitting: *Deleted

## 2015-06-17 ENCOUNTER — Telehealth: Payer: Self-pay | Admitting: Surgery

## 2015-06-17 NOTE — Telephone Encounter (Signed)
Returned call to daughter, Brayton MarsRhaieta.  She states that her Mom is complaining of bilateral ankle pain and does not want to walk.  She feels that her Mom is not walking enough and this is causing the pain.  She denies that the ankles are swollen.  Patient is scheduled for multiple lab studies 08/02/15.  She wants to keep these appts and would like to call if the symptoms should worsen.  We discussed a rolling walker with a seat but patient feels these are for "old people".  If patients ankles begin to have excessive swelling Rhaieta plans to call cardiologist.

## 2015-06-17 NOTE — Telephone Encounter (Signed)
Patients daughter [place] Sink(Rita) LVM on appointment line that her mothers BLE ankles are so painful that she has stopped walking. She would like a call back at [phone].

## 2015-07-05 ENCOUNTER — Other Ambulatory Visit (HOSPITAL_COMMUNITY): Payer: No Typology Code available for payment source

## 2015-07-05 ENCOUNTER — Ambulatory Visit: Payer: No Typology Code available for payment source | Admitting: Vascular Surgery

## 2015-07-05 ENCOUNTER — Encounter (HOSPITAL_COMMUNITY): Payer: No Typology Code available for payment source

## 2015-07-19 ENCOUNTER — Ambulatory Visit: Payer: No Typology Code available for payment source | Admitting: Vascular Surgery

## 2015-07-19 ENCOUNTER — Encounter (HOSPITAL_COMMUNITY): Payer: No Typology Code available for payment source

## 2015-07-26 ENCOUNTER — Encounter: Payer: Self-pay | Admitting: Vascular Surgery

## 2015-07-31 DIAGNOSIS — Z1389 Encounter for screening for other disorder: Secondary | ICD-10-CM | POA: Diagnosis not present

## 2015-07-31 DIAGNOSIS — H6123 Impacted cerumen, bilateral: Secondary | ICD-10-CM | POA: Diagnosis not present

## 2015-07-31 DIAGNOSIS — I700 Atherosclerosis of aorta: Secondary | ICD-10-CM | POA: Diagnosis not present

## 2015-07-31 DIAGNOSIS — Z23 Encounter for immunization: Secondary | ICD-10-CM | POA: Diagnosis not present

## 2015-07-31 DIAGNOSIS — I701 Atherosclerosis of renal artery: Secondary | ICD-10-CM | POA: Diagnosis not present

## 2015-07-31 DIAGNOSIS — I10 Essential (primary) hypertension: Secondary | ICD-10-CM | POA: Diagnosis not present

## 2015-07-31 DIAGNOSIS — I739 Peripheral vascular disease, unspecified: Secondary | ICD-10-CM | POA: Diagnosis not present

## 2015-07-31 DIAGNOSIS — E559 Vitamin D deficiency, unspecified: Secondary | ICD-10-CM | POA: Diagnosis not present

## 2015-07-31 DIAGNOSIS — J449 Chronic obstructive pulmonary disease, unspecified: Secondary | ICD-10-CM | POA: Diagnosis not present

## 2015-07-31 DIAGNOSIS — I482 Chronic atrial fibrillation: Secondary | ICD-10-CM | POA: Diagnosis not present

## 2015-07-31 DIAGNOSIS — Z0000 Encounter for general adult medical examination without abnormal findings: Secondary | ICD-10-CM | POA: Diagnosis not present

## 2015-07-31 DIAGNOSIS — H6120 Impacted cerumen, unspecified ear: Secondary | ICD-10-CM | POA: Diagnosis not present

## 2015-07-31 DIAGNOSIS — D509 Iron deficiency anemia, unspecified: Secondary | ICD-10-CM | POA: Diagnosis not present

## 2015-07-31 DIAGNOSIS — I5030 Unspecified diastolic (congestive) heart failure: Secondary | ICD-10-CM | POA: Diagnosis not present

## 2015-07-31 DIAGNOSIS — D81818 Other biotin-dependent carboxylase deficiency: Secondary | ICD-10-CM | POA: Diagnosis not present

## 2015-07-31 DIAGNOSIS — Z79899 Other long term (current) drug therapy: Secondary | ICD-10-CM | POA: Diagnosis not present

## 2015-08-01 NOTE — Progress Notes (Deleted)
Established Chronic Mesenteric Ischemia, Intermittent Claudication, and Renal Artery Stenosis    History of Present Illness  [name] LatinHelen [name] is a 82 y.o. (08/24/1923) female  who presents with chief complaint: routine follow-up. Patient is s/p L CIA stenting, and L RA stenting. Her CMI sx are unchanged: Pt has had no abdominal pain. She denies food fear. She denies post-prandial pain. Her dietary habits are unchanged: snacking, enjoys fudge brownies. She is able to ambulate enough to complete her ADL but has more episodes of falls recently. She denies any foot wounds, ulcer, or drainage. Her BP control has been poor recently, felt due to family related issues. She denies any changes in her urinary habits.   Patient PMH, PSH, SH, FamHx, Medications, Allergies, and ROS are unchanged from previous visit on 06/29/14  On ROS: no melena or hematochezia or weight loss  Physical Examination   There were no vitals filed for this visit.   There is no weight on file to calculate BMI.   General: A&O x 3, WDWN, elderly and cachectic   Pulmonary: Sym exp, good air movt, CTAB, no rales, rhonchi, & wheezing   Cardiac: RRR, Nl S1, S2, no Murmurs, rubs or gallops   Vascular:  Vessel  Right  Left   Radial  Palpable  Palpable   Brachial  Palpable  Palpable   Carotid  Palpable, without bruit  Palpable, without bruit   Aorta  Non-palpable  N/A   Femoral  Palpable  Palpable   Popliteal  Non-palpable Non-palpable  PT  Non-Palpable  Non-Palpable   DP  Palpable  Not palpable   Gastrointestinal: soft, NTND, -G/R, - HSM, - masses, - CVAT B   Musculoskeletal: M/S 5/5 throughout , Extremities without ischemic changes , no ulcers, no cyanotic toes  Neurologic: Pain and light touch intact in extremities , Motor exam as listed above    Non-Invasive Vascular Imaging   ABI (Date: ***)  R:   ABI: *** (0.78),   DP: ***,   PT: ***,    TBI: ***  L:   ABI: *** (0.63),   DP: ***,   PT: ***,   TBI: ***  B Renovascular Duplex (Date: ***)   R kidney: 7.3 cm, perfusion visualized via collaterals  L kidney: 10.3 cm, stent NV but velocities normal   SMA: >70% stenosis suggested  CA: no visualized  AORTOILIAC Duplex (Date: ***)  Patent L CIA stent  Ao: 83 c/s   PSV in-stent: 202 c/s (<3.0)   Medical Decision Making  [name] LatinHelen [name] is a 82 y.o. (11/05/1923) female  who presents with: stable asx chronic mesenteric ischemia with known SMA and CA occlusion, collateral flow via IMA to SMA, BLE PAD with patent L CIA stent, and patent L RA stent with likely occluded R RA.    Based on her exam and studies, I have offered the patient surveillance studies including: BLE ABI, Aortoiliac duplex, Mesenteric duplex, and B renal artery duplex in the next 12 months.   As we discussed previously, we will avoid any significant operations in this patient due to her cardiac risks > benefit.   She will continue with her Plavix use and we discussed previously.   I discussed in depth with the patient the nature of atherosclerosis, and emphasized the importance of maximal medical management including strict control of blood pressure, blood glucose, and lipid levels, obtaining regular exercise, and cessation of smoking. The patient is aware that without maximal medical management the underlying atherosclerotic disease  process will progress, limiting the benefit of any interventions.   I emphasize the importance of routinely taking her medications to avoid acute complications and also to avoid long-term sequelae of chronic disease.   Thank you for allowing [place] to participate in this patient's care.   Leonides Sake, MD, FACS [facility] Office: [phone] Pager: [phone]  08/01/2015, 9:46 AM

## 2015-08-02 ENCOUNTER — Ambulatory Visit (HOSPITAL_COMMUNITY): Payer: Medicare Other

## 2015-08-02 ENCOUNTER — Encounter: Payer: Medicare Other | Admitting: Vascular Surgery

## 2015-08-05 NOTE — Progress Notes (Signed)
This encounter was created in error - please disregard.

## 2015-09-18 DIAGNOSIS — I10 Essential (primary) hypertension: Secondary | ICD-10-CM | POA: Diagnosis not present

## 2015-09-18 DIAGNOSIS — G500 Trigeminal neuralgia: Secondary | ICD-10-CM | POA: Diagnosis not present

## 2015-09-18 DIAGNOSIS — H6122 Impacted cerumen, left ear: Secondary | ICD-10-CM | POA: Diagnosis not present

## 2015-09-30 ENCOUNTER — Ambulatory Visit
Admission: RE | Admit: 2015-09-30 | Discharge: 2015-09-30 | Disposition: A | Discharge status: Home / Self Care | Payer: Medicare Other | Source: Ambulatory Visit | Attending: Nurse Practitioner | Admitting: Nurse Practitioner

## 2015-09-30 ENCOUNTER — Other Ambulatory Visit: Payer: Self-pay | Admitting: Nurse Practitioner

## 2015-09-30 DIAGNOSIS — W19XXXA Unspecified fall, initial encounter: Secondary | ICD-10-CM

## 2015-09-30 DIAGNOSIS — M25561 Pain in right knee: Secondary | ICD-10-CM | POA: Diagnosis not present

## 2015-09-30 DIAGNOSIS — S8001XA Contusion of right knee, initial encounter: Secondary | ICD-10-CM | POA: Diagnosis not present

## 2015-09-30 DIAGNOSIS — M7989 Other specified soft tissue disorders: Secondary | ICD-10-CM | POA: Diagnosis not present

## 2015-10-03 DIAGNOSIS — I739 Peripheral vascular disease, unspecified: Secondary | ICD-10-CM | POA: Diagnosis not present

## 2015-10-03 DIAGNOSIS — J449 Chronic obstructive pulmonary disease, unspecified: Secondary | ICD-10-CM | POA: Diagnosis not present

## 2015-10-03 DIAGNOSIS — I10 Essential (primary) hypertension: Secondary | ICD-10-CM | POA: Diagnosis not present

## 2015-10-03 DIAGNOSIS — Z87891 Personal history of nicotine dependence: Secondary | ICD-10-CM | POA: Diagnosis not present

## 2015-10-03 DIAGNOSIS — Z9181 History of falling: Secondary | ICD-10-CM | POA: Diagnosis not present

## 2015-10-03 DIAGNOSIS — R296 Repeated falls: Secondary | ICD-10-CM | POA: Diagnosis not present

## 2015-10-10 DIAGNOSIS — J449 Chronic obstructive pulmonary disease, unspecified: Secondary | ICD-10-CM | POA: Diagnosis not present

## 2015-10-10 DIAGNOSIS — Z9181 History of falling: Secondary | ICD-10-CM | POA: Diagnosis not present

## 2015-10-10 DIAGNOSIS — I10 Essential (primary) hypertension: Secondary | ICD-10-CM | POA: Diagnosis not present

## 2015-10-10 DIAGNOSIS — R296 Repeated falls: Secondary | ICD-10-CM | POA: Diagnosis not present

## 2015-10-10 DIAGNOSIS — I739 Peripheral vascular disease, unspecified: Secondary | ICD-10-CM | POA: Diagnosis not present

## 2015-10-10 DIAGNOSIS — Z87891 Personal history of nicotine dependence: Secondary | ICD-10-CM | POA: Diagnosis not present

## 2015-10-16 DIAGNOSIS — I739 Peripheral vascular disease, unspecified: Secondary | ICD-10-CM | POA: Diagnosis not present

## 2015-10-16 DIAGNOSIS — Z87891 Personal history of nicotine dependence: Secondary | ICD-10-CM | POA: Diagnosis not present

## 2015-10-16 DIAGNOSIS — R296 Repeated falls: Secondary | ICD-10-CM | POA: Diagnosis not present

## 2015-10-16 DIAGNOSIS — Z9181 History of falling: Secondary | ICD-10-CM | POA: Diagnosis not present

## 2015-10-16 DIAGNOSIS — I10 Essential (primary) hypertension: Secondary | ICD-10-CM | POA: Diagnosis not present

## 2015-10-16 DIAGNOSIS — J449 Chronic obstructive pulmonary disease, unspecified: Secondary | ICD-10-CM | POA: Diagnosis not present

## 2015-10-18 DIAGNOSIS — I739 Peripheral vascular disease, unspecified: Secondary | ICD-10-CM | POA: Diagnosis not present

## 2015-10-18 DIAGNOSIS — Z9181 History of falling: Secondary | ICD-10-CM | POA: Diagnosis not present

## 2015-10-18 DIAGNOSIS — Z87891 Personal history of nicotine dependence: Secondary | ICD-10-CM | POA: Diagnosis not present

## 2015-10-18 DIAGNOSIS — I10 Essential (primary) hypertension: Secondary | ICD-10-CM | POA: Diagnosis not present

## 2015-10-18 DIAGNOSIS — J449 Chronic obstructive pulmonary disease, unspecified: Secondary | ICD-10-CM | POA: Diagnosis not present

## 2015-10-18 DIAGNOSIS — R296 Repeated falls: Secondary | ICD-10-CM | POA: Diagnosis not present

## 2015-10-21 DIAGNOSIS — R296 Repeated falls: Secondary | ICD-10-CM | POA: Diagnosis not present

## 2015-10-21 DIAGNOSIS — I10 Essential (primary) hypertension: Secondary | ICD-10-CM | POA: Diagnosis not present

## 2015-10-21 DIAGNOSIS — I739 Peripheral vascular disease, unspecified: Secondary | ICD-10-CM | POA: Diagnosis not present

## 2015-10-21 DIAGNOSIS — Z87891 Personal history of nicotine dependence: Secondary | ICD-10-CM | POA: Diagnosis not present

## 2015-10-21 DIAGNOSIS — Z9181 History of falling: Secondary | ICD-10-CM | POA: Diagnosis not present

## 2015-10-21 DIAGNOSIS — J449 Chronic obstructive pulmonary disease, unspecified: Secondary | ICD-10-CM | POA: Diagnosis not present

## 2015-10-24 DIAGNOSIS — I739 Peripheral vascular disease, unspecified: Secondary | ICD-10-CM | POA: Diagnosis not present

## 2015-10-24 DIAGNOSIS — I10 Essential (primary) hypertension: Secondary | ICD-10-CM | POA: Diagnosis not present

## 2015-10-24 DIAGNOSIS — R296 Repeated falls: Secondary | ICD-10-CM | POA: Diagnosis not present

## 2015-10-24 DIAGNOSIS — Z9181 History of falling: Secondary | ICD-10-CM | POA: Diagnosis not present

## 2015-10-24 DIAGNOSIS — J449 Chronic obstructive pulmonary disease, unspecified: Secondary | ICD-10-CM | POA: Diagnosis not present

## 2015-10-24 DIAGNOSIS — Z87891 Personal history of nicotine dependence: Secondary | ICD-10-CM | POA: Diagnosis not present

## 2015-10-31 DIAGNOSIS — R296 Repeated falls: Secondary | ICD-10-CM | POA: Diagnosis not present

## 2015-10-31 DIAGNOSIS — I739 Peripheral vascular disease, unspecified: Secondary | ICD-10-CM | POA: Diagnosis not present

## 2015-10-31 DIAGNOSIS — J449 Chronic obstructive pulmonary disease, unspecified: Secondary | ICD-10-CM | POA: Diagnosis not present

## 2015-10-31 DIAGNOSIS — Z87891 Personal history of nicotine dependence: Secondary | ICD-10-CM | POA: Diagnosis not present

## 2015-10-31 DIAGNOSIS — I10 Essential (primary) hypertension: Secondary | ICD-10-CM | POA: Diagnosis not present

## 2015-10-31 DIAGNOSIS — Z9181 History of falling: Secondary | ICD-10-CM | POA: Diagnosis not present

## 2015-11-01 DIAGNOSIS — I10 Essential (primary) hypertension: Secondary | ICD-10-CM | POA: Diagnosis not present

## 2015-11-01 DIAGNOSIS — J449 Chronic obstructive pulmonary disease, unspecified: Secondary | ICD-10-CM | POA: Diagnosis not present

## 2015-11-01 DIAGNOSIS — I739 Peripheral vascular disease, unspecified: Secondary | ICD-10-CM | POA: Diagnosis not present

## 2015-11-01 DIAGNOSIS — Z87891 Personal history of nicotine dependence: Secondary | ICD-10-CM | POA: Diagnosis not present

## 2015-11-01 DIAGNOSIS — Z9181 History of falling: Secondary | ICD-10-CM | POA: Diagnosis not present

## 2015-11-01 DIAGNOSIS — R296 Repeated falls: Secondary | ICD-10-CM | POA: Diagnosis not present

## 2015-12-12 DIAGNOSIS — S46812A Strain of other muscles, fascia and tendons at shoulder and upper arm level, left arm, initial encounter: Secondary | ICD-10-CM | POA: Diagnosis not present

## 2015-12-18 DIAGNOSIS — M792 Neuralgia and neuritis, unspecified: Secondary | ICD-10-CM | POA: Diagnosis not present

## 2015-12-18 DIAGNOSIS — B029 Zoster without complications: Secondary | ICD-10-CM | POA: Diagnosis not present

## 2016-01-01 DIAGNOSIS — R410 Disorientation, unspecified: Secondary | ICD-10-CM | POA: Diagnosis not present

## 2016-01-01 DIAGNOSIS — B029 Zoster without complications: Secondary | ICD-10-CM | POA: Diagnosis not present

## 2016-01-01 DIAGNOSIS — R634 Abnormal weight loss: Secondary | ICD-10-CM | POA: Diagnosis not present

## 2016-01-02 DIAGNOSIS — R410 Disorientation, unspecified: Secondary | ICD-10-CM | POA: Diagnosis not present

## 2016-01-06 DIAGNOSIS — I739 Peripheral vascular disease, unspecified: Secondary | ICD-10-CM | POA: Diagnosis not present

## 2016-01-06 DIAGNOSIS — D81818 Other biotin-dependent carboxylase deficiency: Secondary | ICD-10-CM | POA: Diagnosis not present

## 2016-01-06 DIAGNOSIS — I482 Chronic atrial fibrillation: Secondary | ICD-10-CM | POA: Diagnosis not present

## 2016-01-06 DIAGNOSIS — I10 Essential (primary) hypertension: Secondary | ICD-10-CM | POA: Diagnosis not present

## 2016-01-06 DIAGNOSIS — J449 Chronic obstructive pulmonary disease, unspecified: Secondary | ICD-10-CM | POA: Diagnosis not present

## 2016-01-06 DIAGNOSIS — I700 Atherosclerosis of aorta: Secondary | ICD-10-CM | POA: Diagnosis not present

## 2016-01-06 DIAGNOSIS — M545 Low back pain: Secondary | ICD-10-CM | POA: Diagnosis not present

## 2016-01-06 DIAGNOSIS — F419 Anxiety disorder, unspecified: Secondary | ICD-10-CM | POA: Diagnosis not present

## 2016-01-06 DIAGNOSIS — N183 Chronic kidney disease, stage 3 (moderate): Secondary | ICD-10-CM | POA: Diagnosis not present

## 2016-01-06 DIAGNOSIS — Z950 Presence of cardiac pacemaker: Secondary | ICD-10-CM | POA: Diagnosis not present

## 2016-01-06 DIAGNOSIS — R413 Other amnesia: Secondary | ICD-10-CM | POA: Diagnosis not present

## 2016-01-06 DIAGNOSIS — I5030 Unspecified diastolic (congestive) heart failure: Secondary | ICD-10-CM | POA: Diagnosis not present

## 2016-03-10 ENCOUNTER — Other Ambulatory Visit: Payer: Self-pay

## 2016-03-10 DIAGNOSIS — I701 Atherosclerosis of renal artery: Secondary | ICD-10-CM

## 2016-03-10 DIAGNOSIS — I779 Disorder of arteries and arterioles, unspecified: Secondary | ICD-10-CM

## 2016-04-28 DIAGNOSIS — N183 Chronic kidney disease, stage 3 (moderate): Secondary | ICD-10-CM | POA: Diagnosis not present

## 2016-04-28 DIAGNOSIS — D81818 Other biotin-dependent carboxylase deficiency: Secondary | ICD-10-CM | POA: Diagnosis not present

## 2016-04-28 DIAGNOSIS — I739 Peripheral vascular disease, unspecified: Secondary | ICD-10-CM | POA: Diagnosis not present

## 2016-04-28 DIAGNOSIS — F419 Anxiety disorder, unspecified: Secondary | ICD-10-CM | POA: Diagnosis not present

## 2016-04-28 DIAGNOSIS — I482 Chronic atrial fibrillation: Secondary | ICD-10-CM | POA: Diagnosis not present

## 2016-04-28 DIAGNOSIS — I700 Atherosclerosis of aorta: Secondary | ICD-10-CM | POA: Diagnosis not present

## 2016-04-28 DIAGNOSIS — M545 Low back pain: Secondary | ICD-10-CM | POA: Diagnosis not present

## 2016-04-28 DIAGNOSIS — E559 Vitamin D deficiency, unspecified: Secondary | ICD-10-CM | POA: Diagnosis not present

## 2016-04-28 DIAGNOSIS — I5030 Unspecified diastolic (congestive) heart failure: Secondary | ICD-10-CM | POA: Diagnosis not present

## 2016-04-28 DIAGNOSIS — R413 Other amnesia: Secondary | ICD-10-CM | POA: Diagnosis not present

## 2016-04-28 DIAGNOSIS — I10 Essential (primary) hypertension: Secondary | ICD-10-CM | POA: Diagnosis not present

## 2016-04-28 DIAGNOSIS — J449 Chronic obstructive pulmonary disease, unspecified: Secondary | ICD-10-CM | POA: Diagnosis not present

## 2016-05-29 ENCOUNTER — Encounter: Payer: Self-pay | Admitting: Vascular Surgery

## 2016-05-29 DIAGNOSIS — I5030 Unspecified diastolic (congestive) heart failure: Secondary | ICD-10-CM | POA: Diagnosis not present

## 2016-05-29 DIAGNOSIS — I739 Peripheral vascular disease, unspecified: Secondary | ICD-10-CM | POA: Diagnosis not present

## 2016-05-29 DIAGNOSIS — N183 Chronic kidney disease, stage 3 (moderate): Secondary | ICD-10-CM | POA: Diagnosis not present

## 2016-05-29 DIAGNOSIS — J449 Chronic obstructive pulmonary disease, unspecified: Secondary | ICD-10-CM | POA: Diagnosis not present

## 2016-05-29 DIAGNOSIS — R413 Other amnesia: Secondary | ICD-10-CM | POA: Diagnosis not present

## 2016-05-29 DIAGNOSIS — I482 Chronic atrial fibrillation: Secondary | ICD-10-CM | POA: Diagnosis not present

## 2016-05-29 DIAGNOSIS — D81818 Other biotin-dependent carboxylase deficiency: Secondary | ICD-10-CM | POA: Diagnosis not present

## 2016-05-29 DIAGNOSIS — M545 Low back pain: Secondary | ICD-10-CM | POA: Diagnosis not present

## 2016-05-29 DIAGNOSIS — I10 Essential (primary) hypertension: Secondary | ICD-10-CM | POA: Diagnosis not present

## 2016-05-29 DIAGNOSIS — I700 Atherosclerosis of aorta: Secondary | ICD-10-CM | POA: Diagnosis not present

## 2016-05-29 DIAGNOSIS — F419 Anxiety disorder, unspecified: Secondary | ICD-10-CM | POA: Diagnosis not present

## 2016-05-29 DIAGNOSIS — Z950 Presence of cardiac pacemaker: Secondary | ICD-10-CM | POA: Diagnosis not present

## 2016-06-03 NOTE — Progress Notes (Deleted)
Established Chronic Mesenteric Ischemia, Intermittent Claudication, and Renal Artery Stenosis    History of Present Illness   [name] LatinHelen [name] is a 83 y.o. (01/09/1924) female who presents with chief complaint: routine follow-up. Patient is s/p L CIA stenting, and L RA stenting. Her CMI sx are unchanged: Pt has had no abdominal pain. She denies food fear. She denies post-prandial pain. Her dietary habits are unchanged: snacking, enjoys fudge brownies. She is able to ambulate enough to complete her ADL but has more episodes of falls recently.  She denies any foot wounds, ulcer, or drainage. Her BP control has been poor recently, felt due to family related issues.  She denies any changes in her urinary habits.   Patient PMH, PSH, SH, FamHx, Medications, Allergies, and ROS are unchanged from previous visit on 06/29/14  Current Outpatient Prescriptions  Medication Sig Dispense Refill  . amLODipine (NORVASC) 10 MG tablet TAKE 1 TABLET (10 MG TOTAL) BY MOUTH EVERY MORNING. 30 tablet 3  . atenolol (TENORMIN) 50 MG tablet Take 50 mg by mouth every morning.     . Calcium Citrate-Vitamin D (CALCIUM CITRATE + D3 PO) Take by mouth.    . citalopram (CELEXA) 10 MG tablet Take 10 mg by mouth daily.    . clopidogrel (PLAVIX) 75 MG tablet Take 1 tablet (75 mg total) by mouth daily. 30 tablet 11  . [facility] IRON 325 (65 FE) MG tablet     . Cyanocobalamin (B-12 PO) Take by mouth.    . ferrous fumarate (HEMOCYTE - 106 MG FE) 325 (106 FE) MG TABS tablet Take 1 tablet by mouth 3 (three) times a week. Take Monday, Wednesday, Friday    . hydrochlorothiazide (MICROZIDE) 12.5 MG capsule TAKE 1 CAPSULE BY MOUTH DAILY. 90 capsule 0  . ipratropium (ATROVENT) 0.03 % nasal spray     . loratadine (ALLERGY) 10 MG tablet Take 10 mg by mouth as needed for allergies.    [name] Kitchen. losartan (COZAAR) 100 MG tablet Take 100 mg by mouth every morning.     [name] Kitchen. oxyCODONE-acetaminophen (PERCOCET/ROXICET) 5-325 MG per tablet Take 0.5  tablets by mouth at bedtime.      No current facility-administered medications for this visit.     On ROS: ***, ***   Physical Examination   There were no vitals filed for this visit.  There is no height or weight on file to calculate BMI. Previous Wt. 127 lb 8 oz  General: A&O x 3, WDWN, elderly and cachectic   Pulmonary: Sym exp, good air movt, CTAB, no rales, rhonchi, & wheezing   Cardiac: RRR, Nl S1, S2, no Murmurs, rubs or gallops   Vascular:  Vessel  Right  Left   Radial  Palpable  Palpable   Brachial  Palpable  Palpable   Carotid  Palpable, without bruit  Palpable, without bruit   Aorta  Non-palpable  N/A   Femoral  Palpable  Palpable   Popliteal  Non-palpable  Non-palpable   PT  Non-Palpable  Non-Palpable   DP  Palpable  Not palpable   Gastrointestinal: soft, NTND, -G/R, - HSM, - masses, - CVAT B   Musculoskeletal: M/S 5/5 throughout , Extremities without ischemic changes , no ulcers, no cyanotic toes  Neurologic: Pain and light touch intact in extremities , Motor exam as listed above   Non-Invasive Vascular Imaging  ABI (Date: ***)  R:   ABI: *** (0.78),   DP: {Signals:19197::"none","mono","bi","tri"}  PT: {Signals:19197::"none","mono","bi","tri"}  TBI:  ***  L:   ABI: *** (  0.63),   DP: {Signals:19197::"none","mono","bi","tri"}  PT: {Signals:19197::"none","mono","bi","tri"}  TBI: ***  B Renovascular Duplex (Date: ***)   R kidney: ***  L kidney: ***  SMA: ***  CA: ***  Aortoiliac Duplex (Date: ***)  ***   Medical Decision Making  [name] [name] is a 83 y.o. (1923/06/03) female who presents with: stable asx chronic mesenteric ischemia with known SMA and CA occlusion, collateral flow via IMA to SMA, BLE PAD with patent L CIA stent, and patent L RA stent with likely occluded R RA.    Based on her exam and studies, I have offered the patient surveillance studies including: BLE ABI,  Aortoiliac duplex, Mesenteric duplex, and B renal artery duplex in the next 12 months.   As we discussed previously, we will avoid any significant operations in this patient due to her cardiac risks > benefit.   She will continue with her Plavix use and we discussed previously.   I discussed in depth with the patient the nature of atherosclerosis, and emphasized the importance of maximal medical management including strict control of blood pressure, blood glucose, and lipid levels, obtaining regular exercise, and cessation of smoking. The patient is aware that without maximal medical management the underlying atherosclerotic disease process will progress, limiting the benefit of any interventions.   I emphasize the importance of routinely taking her medications to avoid acute complications and also to avoid long-term sequelae of chronic disease.   Thank you for allowing [place] to participate in this patient's care.   Leonides Sake, MD, FACS [facility] [place] Office: [phone] Pager: [phone]  06/03/2016, 8:18 AM

## 2016-06-05 ENCOUNTER — Ambulatory Visit: Payer: Medicare Other | Admitting: Vascular Surgery

## 2016-06-05 ENCOUNTER — Inpatient Hospital Stay (HOSPITAL_COMMUNITY): Admission: RE | Admit: 2016-06-05 | Payer: Medicare Other | Source: Ambulatory Visit

## 2016-06-05 ENCOUNTER — Encounter (HOSPITAL_COMMUNITY): Payer: Medicare Other

## 2016-07-23 ENCOUNTER — Encounter: Payer: Self-pay | Admitting: Vascular Surgery

## 2016-07-27 NOTE — Progress Notes (Deleted)
Established Chronic Mesenteric Ischemia, Intermittent Claudication, and Renal Artery Stenosis    History of Present Illness  [name] [name] is a 83 y.o. (01/10/1924) female who presents with chief complaint: routine follow-up.   Prior procedures:  1.  L CIA stenting (07/30/10) 2.  L RA PTA+S (09/08/10)  Her CMI sx are unchanged: Pt has had no abdominal pain. She denies food fear. She denies post-prandial pain. Her dietary habits are unchanged: snacking, enjoys fudge brownies. She is able to ambulate enough to complete her ADL but has more episodes of falls recently.  She denies any foot wounds, ulcer, or drainage. Her BP control has been poor recently, felt due to family related issues.  She denies any changes in her urinary habits.   Patient PMH, PSH, SH, FamHx, Medications, Allergies, and ROS are unchanged from previous visit on 06/29/14  Current Outpatient Prescriptions  Medication Sig Dispense Refill  . amLODipine (NORVASC) 10 MG tablet TAKE 1 TABLET (10 MG TOTAL) BY MOUTH EVERY MORNING. 30 tablet 3  . atenolol (TENORMIN) 50 MG tablet Take 50 mg by mouth every morning.     . Calcium Citrate-Vitamin D (CALCIUM CITRATE + D3 PO) Take by mouth.    . citalopram (CELEXA) 10 MG tablet Take 10 mg by mouth daily.    . clopidogrel (PLAVIX) 75 MG tablet Take 1 tablet (75 mg total) by mouth daily. 30 tablet 11  . [facility] IRON 325 (65 FE) MG tablet     . Cyanocobalamin (B-12 PO) Take by mouth.    . ferrous fumarate (HEMOCYTE - 106 MG FE) 325 (106 FE) MG TABS tablet Take 1 tablet by mouth 3 (three) times a week. Take Monday, Wednesday, Friday    . hydrochlorothiazide (MICROZIDE) 12.5 MG capsule TAKE 1 CAPSULE BY MOUTH DAILY. 90 capsule 0  . ipratropium (ATROVENT) 0.03 % nasal spray     . loratadine (ALLERGY) 10 MG tablet Take 10 mg by mouth as needed for allergies.    [name] Kitchen. losartan (COZAAR) 100 MG tablet Take 100 mg by mouth every morning.     [name] Kitchen. oxyCODONE-acetaminophen (PERCOCET/ROXICET)  5-325 MG per tablet Take 0.5 tablets by mouth at bedtime.      No current facility-administered medications for this visit.     On ROS: no melena or hematochezia or weight loss   Physical Examination   There were no vitals filed for this visit.  There is no height or weight on file to calculate BMI.   ***Physical Examination   There were no vitals filed for this visit.  There is no height or weight on file to calculate BMI.  General {LOC:19197::"Somulent","Alert"}, {Orientation:19197::"Confused","O x 3"}, {Weight:19197::"Obese","Cachectic","WD"}, {General state of health:19197::"Ill appearing","NAD"}  Pulmonary {Chest wall:19197::"Asx chest movement","Sym exp"}, {Air movt:19197::"Decreased *** air movt","good B air movt"}, {BS:19197::"rales on ***","rhonchi on ***","wheezing on ***","CTA B"}  Cardiac {Rhythm:19197::"Irregularly, irregular rate and rhythm","RRR, Nl S1, S2"}, {Murmur:19197::"Murmur present: ***","no Murmurs"}, {Rubs:19197::"Rub present: ***","No rubs"}, {Gallop:19197::"Gallop present: ***","No S3,S4"}  Vascular Vessel Right Left  Radial {Palpable:19197::"Not palpable","Faintly palpable","Palpable"} {Palpable:19197::"Not palpable","Faintly palpable","Palpable"}  Brachial {Palpable:19197::"Not palpable","Faintly palpable","Palpable"} {Palpable:19197::"Not palpable","Faintly palpable","Palpable"}  Carotid Palpable, {Bruit:19197::"Bruit present","No Bruit"} Palpable, {Bruit:19197::"Bruit present","No Bruit"}  Aorta Not palpable N/A  Femoral {Palpable:19197::"Not palpable","Faintly palpable","Palpable"} {Palpable:19197::"Not palpable","Faintly palpable","Palpable"}  Popliteal {Palpable:19197::"Prominently palpable","Not palpable"} {Palpable:19197::"Prominently palpable","Not palpable"}  PT {Palpable:19197::"Not palpable","Faintly palpable","Palpable"} {Palpable:19197::"Not palpable","Faintly palpable","Palpable"}  DP {Palpable:19197::"Not palpable","Faintly  palpable","Palpable"} {Palpable:19197::"Not palpable","Faintly palpable","Palpable"}    Gastrointestinal soft, {Distension:19197::"distended","non-distended"}, {TTP:19197::"TTP in *** quadrant","appropriate tenderness to palpation","non-tender to palpation"}, {Guarding:19197::"Guarding and rebound in *** quadrant","No guarding or rebound"}, {  HSM:19197::"HSM present","no HSM"}, {Masses:19197::"Mass present: ***","no masses"}, {Flank:19197::"CVAT on ***","Flank bruit present on ***","no CVAT B"}, {AAA:19197::"Palpable prominent aortic pulse","No palpable prominent aortic pulse"}, {Surgical incision:19197::"Surg incisions: ***","Surgical incisions well healed"," "}  Musculoskeletal M/S 5/5 throughout {MS:19197::"except ***"," "}, Extremities without ischemic changes {MS:19197::"except ***"," "}, {Edema:19197::"Edema present: ***","No edema present"}, {Varicosities:19197::"Varicosities present: ***","No obvious varicosities "}, {LDS:19197::"Lipodermatosclerosis present: ***","No Lipodermatosclerosis present"}  Neurologic Pain and light touch intact in extremities{CN:19197::" except for decreased sensation in ***"," "}, Motor exam as listed above    Non-Invasive Vascular Imaging  ABI (***)  R:   ABI: *** (0.78),   PT: {Signals:19197::"none","mono","bi","tri"}  DP: {Signals:19197::"none","mono","bi","tri"}  TBI:  ***  L:   ABI: *** (0.63),   PT: {Signals:19197::"none","mono","bi","tri"}  DP: {Signals:19197::"none","mono","bi","tri"}  TBI: ***  B Renovascular Duplex (***)   R kidney: *** cm,   L kidney: *** cm, *** stent   SMA: *** c/s  CA: ***  Aortoiliac Duplex (***)  ***Patent L CIA stent  Ao: *** c/s   In-stent: *** c/s (<3.0)   Medical Decision Making  [name] [name] is a 83 y.o. (December 19, 1923) female who presents with: stable asx chronic mesenteric ischemia with known SMA and CA occlusion, collateral flow via IMA to SMA, BLE PAD with patent L CIA stent, and patent  L RA stent with likely occluded R RA.    Based on her exam and studies, I have offered the patient surveillance studies including: BLE ABI, Aortoiliac duplex, Mesenteric duplex, and B renal artery duplex in the next 12 months.   As we discussed previously, we will avoid any significant operations in this patient due to her cardiac risks > benefit.  The patient is currently not on on statin as not medically indicated.  The patient is currently on an anti-platelet: Plavix.  I discussed in depth with the patient the nature of atherosclerosis, and emphasized the importance of maximal medical management including strict control of blood pressure, blood glucose, and lipid levels, obtaining regular exercise, and cessation of smoking. The patient is aware that without maximal medical management the underlying atherosclerotic disease process will progress, limiting the benefit of any interventions.   I emphasize the importance of routinely taking her medications to avoid acute complications and also to avoid long-term sequelae of chronic disease.   Thank you for allowing [place] to participate in this patient's care.   Leonides Sake, MD, FACS [facility] Office: [phone] Pager: [phone]  07/27/2016, 12:15 PM

## 2016-07-29 ENCOUNTER — Ambulatory Visit (INDEPENDENT_AMBULATORY_CARE_PROVIDER_SITE_OTHER)
Admission: RE | Admit: 2016-07-29 | Discharge: 2016-07-29 | Disposition: A | Discharge status: Home / Self Care | Payer: Medicare Other | Source: Ambulatory Visit | Attending: Vascular Surgery | Admitting: Vascular Surgery

## 2016-07-29 ENCOUNTER — Ambulatory Visit (INDEPENDENT_AMBULATORY_CARE_PROVIDER_SITE_OTHER): Payer: Medicare Other | Admitting: Family

## 2016-07-29 ENCOUNTER — Ambulatory Visit (HOSPITAL_COMMUNITY)
Admission: RE | Admit: 2016-07-29 | Discharge: 2016-07-29 | Disposition: A | Discharge status: Home / Self Care | Payer: Medicare Other | Source: Ambulatory Visit | Attending: Vascular Surgery | Admitting: Vascular Surgery

## 2016-07-29 ENCOUNTER — Encounter: Payer: Self-pay | Admitting: Family

## 2016-07-29 VITALS — BP 202/105 | HR 98 | Temp 96.4°F | Resp 20 | Ht 63.0 in | Wt 114.0 lb

## 2016-07-29 DIAGNOSIS — I779 Disorder of arteries and arterioles, unspecified: Secondary | ICD-10-CM | POA: Diagnosis not present

## 2016-07-29 DIAGNOSIS — K551 Chronic vascular disorders of intestine: Secondary | ICD-10-CM | POA: Diagnosis not present

## 2016-07-29 DIAGNOSIS — R030 Elevated blood-pressure reading, without diagnosis of hypertension: Secondary | ICD-10-CM | POA: Diagnosis not present

## 2016-07-29 DIAGNOSIS — I701 Atherosclerosis of renal artery: Secondary | ICD-10-CM

## 2016-07-29 DIAGNOSIS — Z95828 Presence of other vascular implants and grafts: Secondary | ICD-10-CM

## 2016-07-29 DIAGNOSIS — Z87891 Personal history of nicotine dependence: Secondary | ICD-10-CM

## 2016-07-29 NOTE — Patient Instructions (Addendum)
Chronic Mesenteric Ischemia Mesenteric ischemia is poor blood flow (circulation) in the vessels that supply blood to the stomach, intestines, and liver (mesenteric organs). Chronic mesenteric ischemia, also called mesenteric angina or intestinal angina, is a Lindenberger-term (chronic) condition. It happens when an artery or vein that provides blood to the mesenteric organs gradually becomes blocked or narrow, restricting the blood supply to the organs. When the blood supply is severely restricted, the mesenteric organs cannot work properly. What are the causes? This condition is commonly caused by fatty deposits that build up in an artery (plaque), which can narrow the artery and restrict blood flow. Other causes include:  Weakened areas in blood vessel walls (aneurysms).  Conditions that cause twisting or inflammation of blood vessels, such as fibromuscular dysplasia or arteritis.  A disorder in which blood clots form in the veins (venous thrombosis).  Scarring and thickening (fibrosis) of blood vessels caused by radiation therapy.  A tear in the aorta, the body's main artery (aortic dissection).  Blood vessel problems after illegal drug use, such as use of cocaine.  Tumors in the nervous system (neurofibromatosis).  Certain autoimmune diseases, such as lupus. What increases the risk? The following factors may make you more likely to develop this condition:  Being female.  Being over age 81, especially if you have a history of heart problems.  Smoking.  Congestive heart failure.  Irregular heartbeat (arrhythmia).  Having a history of heart attack or stroke.  Diabetes.  High cholesterol.  High blood pressure (hypertension).  Being overweight or obese.  Kidney disease (renal disease) requiring dialysis. What are the signs or symptoms? Symptoms of this condition include:  Abdomen (abdominal) pain or cramps that develop 15-60 minutes after a meal. This pain may last for 1-3  hours. Some people may develop a fear of eating because of this symptom.  Weight loss.  Diarrhea.  Bloody stool.  Nausea.  Vomiting.  Bloating.  Abdominal pain after stress or with exercise. How is this diagnosed? This condition is diagnosed based on:  Your medical history.  A physical exam.  Tests, such as:  Ultrasound.  CT scan.  Blood tests.  Urine tests.  An imaging test that involves injecting a dye into your arteries to show blood flow through blood vessels (angiogram). This can help to show if there are any blockages in the vessels that lead to the intestines.  Passing a small probe through the mouth and into the stomach to measure the output of carbon dioxide (gastric tonometry). This can help to indicate whether there is decreased blood flow to the stomach and intestines. How is this treated? This condition may be treated with:  Dietary changes such as eating smaller, low-fat, meals more frequently.  Lifestyle changes to treat underlying conditions that contribute to the disease, such as high cholesterol and high blood pressure.  Medicines to reduce blood clotting and increase blood flow.  Surgery to remove the blockage, repair arteries or veins, and restore blood flow. This may involve:  Angioplasty. This is surgery to widen the affected artery, reduce the blockage, and sometimes insert a small, mesh tube (stent).  Bypass surgery. This may be done to go around (bypass) the blockage and reconnect healthy arteries or veins.  Placing a stent in the affected area. This may be done to help keep blocked arteries open. Follow these instructions at home: Eating and drinking   Eat a heart-healthy diet. This includes fresh fruits and vegetables, whole grains, and lean proteins like chicken, fish, eggs,  and beans.  Avoid foods that contain a lot of:  Salt (sodium).  Sugar.  Saturated fat (such as red meat).  Trans fat (such as fried foods).  Stay  hydrated. Drink enough fluid to keep your urine clear or pale yellow. Lifestyle   Stay active and get regular exercise as told by your health care provider. Aim for 150 minutes of moderate activity or 75 minutes of vigorous activity a week. Ask your health care provider what activities and forms of exercise are safe for you.  Maintain a healthy weight.  Work with your health care provider to manage your cholesterol.  Manage any other health problems you have, such as high blood pressure, diabetes, or heart rhythm problems.  Do not use any products that contain nicotine or tobacco, such as cigarettes and e-cigarettes. If you need help quitting, ask your health care provider. General instructions   Take over-the-counter and prescription medicines only as told by your health care provider.  Keep all follow-up visits as told by your health care provider. This is important. Contact a health care provider if:  Your symptoms do not improve or they return after treatment.  You have a fever. Get help right away if:  You have severe abdominal pain.  You have severe chest pain.  You have shortness of breath.  You feel weak or dizzy.  You have palpitations.  You have numbness or weakness in your face, arm, or leg.  You are confused.  You have trouble speaking or people have trouble understanding what you are saying.  You are constipated.  You have trouble urinating.  You have blood in your stool.  You have severe nausea, vomiting, or persistent diarrhea. Summary  Mesenteric ischemia is poor circulation in the vessels that supply blood to the the stomach, intestines, and liver (mesenteric organs).  This condition happens when an artery or vein that provides blood to the mesenteric organs gradually becomes blocked or narrow, restricting the blood supply to the organs.  This condition is commonly caused by fatty deposits that build up in an artery (plaque), which can narrow the  artery and restrict blood flow.  You are more likely to develop this condition if you are over age 81 and have a history of heart problems, high blood pressure, diabetes, or high cholesterol.  This condition is usually treated with medicines, dietary and lifestyle changes, and surgery to remove the blockage, repair arteries or veins, and restore blood flow. This information is not intended to replace advice given to you by your health care provider. Make sure you discuss any questions you have with your health care provider. Document Released: 10/27/2010 Document Revised: 02/22/2016 Document Reviewed: 02/22/2016 Elsevier Interactive Patient Education  2017 [employer]     Renal Artery Stenosis Renal artery stenosis (RAS) is narrowing of the artery that carries blood to your kidneys. It can affect one or both kidneys. Your kidneys filter waste and extra fluid from your blood. You get rid of the waste and fluid when you urinate. Your kidneys also make an important chemical messenger (hormone) called renin. Renin helps regulate your blood pressure. The first sign of RAS may be high blood pressure. Over time, other symptoms can develop. What are the causes? Plaque buildup in your arteries (atherosclerosis) is the main cause of RAS. The plaques that cause this are made up of:  Fat.  Cholesterol.  Calcium.  Other substances. As these substances build up in your renal artery, this slows the blood supply  to your kidneys. The lack of blood and oxygen causes the signs and symptoms of RAS. A much less common cause of RAS is a disease called fibromuscular dysplasia. This disease causes abnormal cell growth that narrows the renal artery. It is not related to atherosclerosis. It occurs mostly in women who are 6625-91 years old. It may be passed down through families. What increases the risk? You may be at risk for renal artery stenosis if you:  Are a man who is at least 81 years old.  Are a woman  who is at least 81 years old.  Have high blood pressure.  Have high cholesterol.  Are a smoker.  Abuse alcohol.  Have diabetes or prediabetes.  Are overweight.  Have a family history of early heart disease. What are the signs or symptoms? RAS usually develops slowly. You may not have any signs or symptoms at first. The earliest signs may be:  Developing high blood pressure.  A sudden increase in existing high blood pressure.  No longer responding to medicine that used to control your blood pressure. Later signs and symptoms are due to kidney damage. They may include:  Fatigue.  Shortness of breath.  Swollen legs and feet.  Dry skin.  Headaches.  Muscle cramps.  Loss of appetite.  Nausea or vomiting. How is this diagnosed? Your health care provider may suspect RAS based on changes in your blood pressure and your risk factors. A physical exam will be done. Your health care provider may use a stethoscope to listen for a whooshing sound (bruit) that can occur where the renal artery is blocking blood flow. Several tests may be done to confirm a diagnosis of RAS. These may include:  Blood and urine tests to check your kidney function.  Imaging tests of your kidneys, such as:  A test that involves using sound waves to create an image of your kidneys and the blood flow to your kidneys (ultrasound).  A test in which dye is injected into one of your blood vessels so images can be taken as the dye flows through your renal arteries (angiogram). These tests can be done using X-rays, a CT scan (computed tomography angiogram, CTA), or a type of MRI (magnetic resonance angiogram, MRA). How is this treated? Making lifestyle changes to reduce your risk factors is the first treatment option for early RAS. If the blood flow to one of your kidneys is cut by more than half, you may need medicine to:  Lower your blood pressure. This is the main medical treatment for RAS. You may need  more than one type of medicine for this. The two types that work best for RAS are:  ACE inhibitors.  Angiotensin receptor blockers.  Reduce fluid in the body (diuretics).  Lower your cholesterol (statins). If medicine is not enough to control RAS, you may need surgery. This may involve:  Threading a tube with an inflatable balloon into the renal artery to force it open (angioplasty).  Removing plaque from inside the artery (endarterectomy). Follow these instructions at home:  Take medicines only as directed by your health care provider.  Make any lifestyle changes recommended by your health care provider. This may include:  Working with a dietitian to maintain a heart-healthy diet. This type of diet is low in saturated fat, salt, and added sugar.  Starting an exercise program as directed by your health care provider.  Maintaining a healthy weight.  Quitting smoking.  Not abusing alcohol.  Keep all follow-up visits  as directed by your health care provider. This is important. Contact a health care provider if:  Your symptoms of RAS are not getting better.  Your symptoms are changing or getting worse. Get help right away if:  You have very bad pain in your back or abdomen.  You have blood in your urine. This information is not intended to replace advice given to you by your health care provider. Make sure you discuss any questions you have with your health care provider. Document Released: 12/03/2004 Document Revised: 08/15/2015 Document Reviewed: 06/22/2013 Elsevier Interactive Patient Education  2017 [employer]          Peripheral Vascular Disease Peripheral vascular disease (PVD) is a disease of the blood vessels that are not part of your heart and brain. A simple term for PVD is poor circulation. In most cases, PVD narrows the blood vessels that carry blood from your heart to the rest of your body. This can result in a decreased supply of blood to your arms,  legs, and internal organs, like your stomach or kidneys. However, it most often affects a person's lower legs and feet. There are two types of PVD.  Organic PVD. This is the more common type. It is caused by damage to the structure of blood vessels.  Functional PVD. This is caused by conditions that make blood vessels contract and tighten (spasm). Without treatment, PVD tends to get worse over time. PVD can also lead to acute ischemic limb. This is when an arm or limb suddenly has trouble getting enough blood. This is a medical emergency. Follow these instructions at home:  Take medicines only as told by your doctor.  Do not use any tobacco products, including cigarettes, chewing tobacco, or electronic cigarettes. If you need help quitting, ask your doctor.  Lose weight if you are overweight, and maintain a healthy weight as told by your doctor.  Eat a diet that is low in fat and cholesterol. If you need help, ask your doctor.  Exercise regularly. Ask your doctor for some good activities for you.  Take good care of your feet.  Wear comfortable shoes that fit well.  Check your feet often for any cuts or sores. Contact a doctor if:  You have cramps in your legs while walking.  You have leg pain when you are at rest.  You have coldness in a leg or foot.  Your skin changes.  You are unable to get or have an erection (erectile dysfunction).  You have cuts or sores on your feet that are not healing. Get help right away if:  Your arm or leg turns cold and blue.  Your arms or legs become red, warm, swollen, painful, or numb.  You have chest pain or trouble breathing.  You suddenly have weakness in your face, arm, or leg.  You become very confused or you cannot speak.  You suddenly have a very bad headache.  You suddenly cannot see. This information is not intended to replace advice given to you by your health care provider. Make sure you discuss any questions you have  with your health care provider. Document Released: 06/03/2009 Document Revised: 08/15/2015 Document Reviewed: 08/17/2013 Elsevier Interactive Patient Education  2017 [employer].

## 2016-07-29 NOTE — Progress Notes (Signed)
CC: Follow up Chronic Mesenteric Ischemia, Intermittent Claudication, and Renal Artery Stenosis   History of Present Illness  [name] [name] is a 83 y.o. (23-Sep-1923) female who is s/p L CIA stenting, and L RA stenting by Dr. Imogene Burn. Her CMI sx are unchanged: Pt has had no abdominal pain. She denies food fear. She denies post-prandial pain. Her dietary habits are unchanged: snacking, enjoys fudge brownies. She is able to ambulate enough to complete her ADL but has more episodes of falls recently.  She denies any foot wounds, ulcer, or drainage. Her BP control has been poor recently, felt due to family related issues.  She denies any changes in her urinary habits.   On ROS: no melena or hematochezia.  Dr. Imogene Burn evaluated pt on 06-29-14. At that time pt had stable asx chronic mesenteric ischemia with known SMA and CA occlusion, collateral flow via IMA to SMA, BLE PAD with patent L CIA stent, and patent L RA stent with likely occluded R RA.  Based on her exam and studies, Dr. Imogene Burn offered the patient surveillance studies including: BLE ABI, Aortoiliac duplex, Mesenteric duplex, and B renal artery duplex in the next 12 months.   As we discussed previously, we will avoid any significant operations in this patient due to her cardiac risks > benefit.  She will continue with her Plavix use and we discussed previously.   Daughter states pt has been sedentary over the cold months; she received home physical therapy in October 2017 for gait strengthening since she fell several times.  Daughter states that her mother stopped taking all of her medications in July 2017, her reason was that "they are not working".   114 # today, 127 # in April 2016.  Pt denies post prandial abdominal pain. Daughter states pt seems to c/o food getting stuck in her throat at times for almost a year.    Past Medical History:  Diagnosis Date  . Anemia   . Chronic kidney disease   . 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 COPD (chronic obstructive pulmonary disease) (HCC)   . Diverticulosis   . Hearing difficulty of left ear   . Hypertension   . Mesenteric ischemia, chronic (HCC)   . Pacemaker battery depletion   . Paroxysmal supraventricular tachycardia (HCC)   . SBO (small bowel obstruction) (HCC)    history partial  . Tachy-brady syndrome (HCC)    pacemaker-placed about 6 yrs ago.    Social History Social History  Substance Use Topics  . Smoking status: Former Smoker    Packs/day: 1.00    Years: 65.00    Types: Cigarettes    Quit date: 06/22/2010  . Smokeless tobacco: Never Used  . Alcohol use No    Family History Family History  Problem Relation Age of Onset  . Cancer Brother   . Diabetes Brother   . Heart disease Brother   . Hyperlipidemia Brother     Surgical History Past Surgical History:  Procedure Laterality Date  . ABDOMINAL HYSTERECTOMY    . APPENDECTOMY    . ILIAC ARTERY STENT  07/30/10   Left CIA with angiogram  . PACEMAKER GENERATOR CHANGE N/A 09/18/2013   Procedure: PACEMAKER GENERATOR CHANGE;  Surgeon: Duke Salvia, MD;  Location: [facility] CATH LAB;  Service: Cardiovascular;  Laterality: N/A;  . PACEMAKER INSERTION      No Known Allergies  Current Outpatient Prescriptions  Medication Sig Dispense Refill  . diphenhydramine-acetaminophen (TYLENOL PM)  25-500 MG TABS tablet Take 2 tablets by mouth at bedtime as needed.    [name] Kitchen amLODipine (NORVASC) 10 MG tablet TAKE 1 TABLET (10 MG TOTAL) BY MOUTH EVERY MORNING. (Patient not taking: Reported on 07/29/2016) 30 tablet 3  . atenolol (TENORMIN) 50 MG tablet Take 50 mg by mouth every morning.     . Calcium Citrate-Vitamin D (CALCIUM CITRATE + D3 PO) Take by mouth.    . citalopram (CELEXA) 10 MG tablet Take 10 mg by mouth daily.    . clopidogrel (PLAVIX) 75 MG tablet Take 1 tablet (75 mg total) by mouth daily. (Patient not taking: Reported on 07/29/2016) 30 tablet 11  . [employer] IRON 325 (65 FE) MG tablet     .  Cyanocobalamin (B-12 PO) Take by mouth.    . ferrous fumarate (HEMOCYTE - 106 MG FE) 325 (106 FE) MG TABS tablet Take 1 tablet by mouth 3 (three) times a week. Take Monday, Wednesday, Friday    . hydrochlorothiazide (MICROZIDE) 12.5 MG capsule TAKE 1 CAPSULE BY MOUTH DAILY. (Patient not taking: Reported on 07/29/2016) 90 capsule 0  . ipratropium (ATROVENT) 0.03 % nasal spray     . loratadine (ALLERGY) 10 MG tablet Take 10 mg by mouth as needed for allergies.    [name] Kitchen losartan (COZAAR) 100 MG tablet Take 100 mg by mouth every morning.     [name] Kitchen oxyCODONE-acetaminophen (PERCOCET/ROXICET) 5-325 MG per tablet Take 0.5 tablets by mouth at bedtime.      No current facility-administered medications for this visit.     ROS: see HPI for pertinent positives and negatives    Physical Examination  Vitals:   07/29/16 1112 07/29/16 1116  BP: (!) 190/104 (!) 202/105  Pulse: 98   Resp: 20   Temp: (!) 96.4 F (35.8 C)   TempSrc: Oral   SpO2: 94%   Weight: 114 lb (51.7 kg)   Height: 5\' 3"  (1.6 m)    Body mass index is 20.19 kg/m.  General: A&O x 3, WDWN, elderly and cachectic   Pulmonary: Sym exp, good air movt, CTAB, no rales, rhonchi, & wheezing   Cardiac: RRR, Nl S1, S2, no Murmurs, rubs or gallops   Vascular:  Vessel  Right  Left   Radial  Palpable  Palpable   Brachial  Palpable  Palpable   Carotid  Palpable, without bruit  Palpable, without bruit   Aorta  Non-palpable  N/A   Femoral  Palpable  Palpable   Popliteal  Non-palpable  Non-palpable   PT  Non-Palpable  Non-Palpable   DP  Palpable  Not palpable   Gastrointestinal: soft, NTND, -G/R, - HSM, - palpable masses, - CVAT B   Musculoskeletal: M/S 5/5 throughout , Extremities without ischemic changes , no ulcers, no cyanotic toes  Neurologic: Pain and light touch intact in extremities , Motor exam as listed above     Non-Invasive Vascular Imaging(Date: 07/29/2016):   Left Renal Artery  Duplex: Right renal artery finding c/w known occlusion. Left renal stent patent. No significant change since the exam on 06-29-14.   Left Iliac artery stent and mesenteric artery Duplex: Ectasia of the mid aorta measuring 2.4 x 2.6 cm. IMA velocity of 547 cm/s at the origin.  Celiac artery not visualized. SMA appears to be fed via the IMA.   Patent left CIA stent with no significant stenosis.  No significant change compared to the exam on 06-29-14.  ABI: Right: 0.89 (0.78, 06-29-14), waveforms: PT: mono, DP: triphasic; TBI: 0.72  Left:  0.59 (0.63, 06-29-14), waveforms: monophasic; TBI; 0.54  Improved right, and stable left ABI.    Medical Decision Making  Mearl LatinHelen Tunison is a 83 y.o. female who presents with: stable asx chronic mesenteric ischemia with known SMA and CA occlusion, collateral flow via IMA to SMA, BLE PAD with patent L CIA stent, and patent L RA stent with likely occluded R RA.   Dr. Imogene Burnhen spoke with pt and daughter.   I had a long discussion with pt and her daughter re pt taking her prescribed medications, and the likelihood of her having a stroke or MI with her blood pressure this elevated, and the ongoing damage very elevated blood pressure does to her kidney function. Pt's daughter expressed suport for this advice,a dn frustration that her mother does not seem to grasp the implications of pt not taking her medications.   She is not steady on her feet: daily seated leg exercises demonstrated and discussed to improve arterial perfusion of her LE's.    Based on her exam and studies, I have offered the patient return in 1 year with ABI's, left iliac artery stent duplex, and left renal artery duplex.  I discussed in depth with the patient the nature of atherosclerosis, and emphasized the importance of maximal medical management including strict control of blood pressure, blood glucose, and lipid levels, obtaining regular exercise, and cessation of smoking.    The patient is  aware that without maximal medical management the underlying atherosclerotic disease process will progress, limiting the benefit of any interventions. The patient is currently not on a statin.   The patient is currently on an anti-platelet: Plavix.    Thank you for allowing us to participate in this patient's care.  Charisse MarchSuzanne Nickel, RN, MSN, FNP-C [facility] Office: [phone]  Clinic MD: Imogene Burnhen  07/29/2016, 11:20 AM

## 2016-07-31 ENCOUNTER — Ambulatory Visit: Payer: Medicare Other | Admitting: Vascular Surgery

## 2016-08-07 NOTE — Addendum Note (Signed)
Addended by: Burton ApleyPETTY, Daffney Greenly A on: 08/07/2016 09:28 AM   Modules accepted: Orders

## 2016-09-07 ENCOUNTER — Emergency Department (HOSPITAL_BASED_OUTPATIENT_CLINIC_OR_DEPARTMENT_OTHER)
Admission: EM | Admit: 2016-09-07 | Discharge: 2016-09-08 | Disposition: A | Discharge status: Home / Self Care | Payer: Medicare Other | Attending: Emergency Medicine | Admitting: Emergency Medicine

## 2016-09-07 ENCOUNTER — Encounter (HOSPITAL_BASED_OUTPATIENT_CLINIC_OR_DEPARTMENT_OTHER): Payer: Self-pay | Admitting: *Deleted

## 2016-09-07 DIAGNOSIS — H9202 Otalgia, left ear: Secondary | ICD-10-CM | POA: Diagnosis not present

## 2016-09-07 DIAGNOSIS — I5032 Chronic diastolic (congestive) heart failure: Secondary | ICD-10-CM | POA: Diagnosis not present

## 2016-09-07 DIAGNOSIS — J449 Chronic obstructive pulmonary disease, unspecified: Secondary | ICD-10-CM | POA: Diagnosis not present

## 2016-09-07 DIAGNOSIS — Z79899 Other long term (current) drug therapy: Secondary | ICD-10-CM | POA: Diagnosis not present

## 2016-09-07 DIAGNOSIS — Z7902 Long term (current) use of antithrombotics/antiplatelets: Secondary | ICD-10-CM | POA: Insufficient documentation

## 2016-09-07 DIAGNOSIS — I10 Essential (primary) hypertension: Secondary | ICD-10-CM | POA: Insufficient documentation

## 2016-09-07 DIAGNOSIS — I480 Paroxysmal atrial fibrillation: Secondary | ICD-10-CM | POA: Diagnosis not present

## 2016-09-07 DIAGNOSIS — Z87891 Personal history of nicotine dependence: Secondary | ICD-10-CM | POA: Diagnosis not present

## 2016-09-07 DIAGNOSIS — H6122 Impacted cerumen, left ear: Secondary | ICD-10-CM | POA: Diagnosis not present

## 2016-09-07 DIAGNOSIS — H6121 Impacted cerumen, right ear: Secondary | ICD-10-CM | POA: Diagnosis not present

## 2016-09-07 DIAGNOSIS — I70213 Atherosclerosis of native arteries of extremities with intermittent claudication, bilateral legs: Secondary | ICD-10-CM | POA: Insufficient documentation

## 2016-09-07 DIAGNOSIS — Z950 Presence of cardiac pacemaker: Secondary | ICD-10-CM | POA: Insufficient documentation

## 2016-09-07 NOTE — ED Triage Notes (Signed)
Left ear pain. Daughter states she has had this problem before and it is usually a build up of ear wax.

## 2016-09-08 NOTE — ED Provider Notes (Signed)
[facility] Provider Note   CSN: [id number] Arrival date & time: 09/07/16  2041     History   Chief Complaint Chief Complaint  Patient presents with  . Ear Pain    HPI [name] [name] is a 83 y.o. female presents to the ED complaining of intermittent left ear pain 3 days. Daughter at bedside reports patient often gets ear wax buildup that present in similar way. Daughter used warm water and peroxide in left ear and was able to remove a large amount of earwax 2 days ago. Since earwax removal at home ear pain has improved.  Daughter concerned that patient may have more earwax to be removed in that ear.   Patient denies fevers, facial pain, tinnitus, muffled hearing, dizziness, vertigo, preceding viral URI.  HPI  Past Medical History:  Diagnosis Date  . Anemia   . Chronic kidney disease   . 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 COPD (chronic obstructive pulmonary disease) (HCC)   . Diverticulosis   . Hearing difficulty of left ear   . Hypertension   . Mesenteric ischemia, chronic (HCC)   . Pacemaker battery depletion   . Paroxysmal supraventricular tachycardia (HCC)   . SBO (small bowel obstruction) (HCC)    history partial  . Tachy-brady syndrome (HCC)    pacemaker-placed about 6 yrs ago.    Patient Active Problem List   Diagnosis Date Noted  . Paroxysmal atrial fibrillation (HCC) 10/19/2013  . Chronic diastolic heart failure (HCC) 10/19/2013  . Essential hypertension 10/19/2013  . History of permanent cardiac pacemaker placement 10/19/2013  . Tachy-brady syndrome (HCC)   . Renal artery atherosclerosis, unilateral (HCC) 04/19/2011  . Anemia associated with acute blood loss 04/18/2011  . COPD (chronic obstructive pulmonary disease) with chronic bronchitis (HCC) 04/18/2011  . Oral ulcer 04/18/2011  . Atherosclerosis of native arteries of extremity with intermittent claudication (HCC) 04/03/2011  . Peripheral arterial occlusive  disease (HCC) 08/29/2010  . Chronic mesenteric ischemia (HCC) 08/29/2010    Past Surgical History:  Procedure Laterality Date  . ABDOMINAL HYSTERECTOMY    . APPENDECTOMY    . ILIAC ARTERY STENT  07/30/10   Left CIA with angiogram  . PACEMAKER GENERATOR CHANGE N/A 09/18/2013   Procedure: PACEMAKER GENERATOR CHANGE;  Surgeon: Duke Salvia, MD;  Location: [facility] CATH LAB;  Service: Cardiovascular;  Laterality: N/A;  . PACEMAKER INSERTION      OB History    No data available       Home Medications    Prior to Admission medications   Medication Sig Start Date End Date Taking? Authorizing Provider  amLODipine (NORVASC) 10 MG tablet TAKE 1 TABLET (10 MG TOTAL) BY MOUTH EVERY MORNING. Patient not taking: Reported on 07/29/2016 09/25/14   Lyn Records, MD  atenolol (TENORMIN) 50 MG tablet Take 50 mg by mouth every morning.     [provider]  Calcium Citrate-Vitamin D (CALCIUM CITRATE + D3 PO) Take by mouth.    [provider]  citalopram (CELEXA) 10 MG tablet Take 10 mg by mouth daily.    [provider]  clopidogrel (PLAVIX) 75 MG tablet Take 1 tablet (75 mg total) by mouth daily. Patient not taking: Reported on 07/29/2016 04/12/14   Fransisco Hertz, MD  [facility] IRON 325 (65 FE) MG tablet  05/26/14   [provider]  Cyanocobalamin (B-12 PO) Take by mouth.    [provider]  diphenhydramine-acetaminophen (TYLENOL PM) 25-500 MG TABS  tablet Take 2 tablets by mouth at bedtime as needed.    [provider]  ferrous fumarate (HEMOCYTE - 106 MG FE) 325 (106 FE) MG TABS tablet Take 1 tablet by mouth 3 (three) times a week. Take Monday, Wednesday, Friday    [provider]  hydrochlorothiazide (MICROZIDE) 12.5 MG capsule TAKE 1 CAPSULE BY MOUTH DAILY. Patient not taking: Reported on 07/29/2016 01/01/15   Duke Salvia, MD  ipratropium (ATROVENT) 0.03 % nasal spray  04/23/14   [provider]  loratadine (ALLERGY) 10 MG tablet Take 10 mg by  mouth as needed for allergies.    [provider]  losartan (COZAAR) 100 MG tablet Take 100 mg by mouth every morning.     [provider]  oxyCODONE-acetaminophen (PERCOCET/ROXICET) 5-325 MG per tablet Take 0.5 tablets by mouth at bedtime.  08/07/13   [provider]    Family History Family History  Problem Relation Age of Onset  . Cancer Brother   . Diabetes Brother   . Heart disease Brother   . Hyperlipidemia Brother     Social History Social History  Substance Use Topics  . Smoking status: Former Smoker    Packs/day: 1.00    Years: 65.00    Types: Cigarettes    Quit date: 06/22/2010  . Smokeless tobacco: Never Used  . Alcohol use No     Allergies   Patient has no known allergies.   Review of Systems Review of Systems  Constitutional: Negative for fever.  HENT: Positive for ear pain. Negative for ear discharge and facial swelling.   Neurological: Negative for dizziness.     Physical Exam Updated Vital Signs BP (!) 161/111   Pulse (!) 111   Temp 98.6 F (37 C) (Oral)   Resp 20   Ht 5\' 2"  (1.575 m)   Wt 51.7 kg (114 lb)   SpO2 93%   BMI 20.85 kg/m   Physical Exam  Constitutional: She is oriented to person, place, and time. She appears well-developed and well-nourished. No distress.  NAD.  HENT:  Head: Normocephalic and atraumatic.  Right Ear: External ear normal.  Left Ear: Tympanic membrane and external ear normal.  Nose: Nose normal.  LEFT EAR: External ear without lesions, swelling, deformities or tenderness in mastoid area. Middle ear canal clear with mild erythema but no edema. Mild pain reported with external ear manipulation.  TMs pearly gray with visible cone of light and bony landmarks bilaterally, no bulging or cloudiness.[name] Kitchen   RIGHT EAR: Completely occluded with cerumen. Mild pain reported with external ear manipulation.   Eyes: Conjunctivae and EOM are normal. Pupils are equal, round, and reactive to light. No scleral  icterus.  Neck: Normal range of motion. Neck supple.  Cardiovascular: Normal rate, regular rhythm and normal heart sounds.   No murmur heard. Pulmonary/Chest: Effort normal and breath sounds normal. She has no wheezes.  Musculoskeletal: Normal range of motion. She exhibits no deformity.  Neurological: She is alert and oriented to person, place, and time.  Skin: Skin is warm and dry. Capillary refill takes less than 2 seconds.  Psychiatric: She has a normal mood and affect. Her behavior is normal. Judgment and thought content normal.  Nursing note and vitals reviewed.    ED Treatments / Results  Labs (all labs ordered are listed, but only abnormal results are displayed) Labs Reviewed - No data to display  EKG  EKG Interpretation None       Radiology No results found.  Procedures Procedures (including critical care time)  Medications Ordered in ED Medications - No data to display   Initial Impression / Assessment and Plan / ED Course  I have reviewed the triage vital signs and the nursing notes.  Pertinent labs & imaging results that were available during my care of the patient were reviewed by me and considered in my medical decision making (see chart for details).    83 year old female with medical history significant for recurrent cerumen impaction presents to the ED with left ear pain 3 days. Daughter at bedside reports she removed a significant amount of earwax from the left ear 2 days ago and ear pain has since started to improve. Daughter brought pt bc concerned that there may be more earwax. On exam left ear canal and tympanic membrane are actually completely normal. Right ear is occluded with cerumen. On repeat exam after cerumen removal exam is reassuring. No evidence of otitis media or externa. No pain with external ear movement. Patient is considered safe for discharge at this time. We'll discharge with Debrox and follow up with PCP. Advised ENT referral for  recurrent cerumen impaction. Patient and daughter at bedside agreeable with plan.   Final Clinical Impressions(s) / ED Diagnoses   Final diagnoses:  Left ear pain  Impacted cerumen of right ear    New Prescriptions New Prescriptions   No medications on file     Jerrell MylarGibbons, Jony Ladnier J, PA-C 09/08/16 0051    Molpus, Jonny RuizJohn, MD 09/08/16 [phone]

## 2016-09-08 NOTE — Discharge Instructions (Signed)
Your left ear appeared normal on exam today.   You had ear wax build up in your right ear which was removed.   Debrox is an over the counter hydrogen peroxide solution. Use this to break up remaining ear wax in your right ear.   Tylenol for ear discomfort.   Follow up with primary care provider for recurrent ear wax build up, he/she may refer you to an ear, nose and throat specialist who may follow you and do periodic ear wax removals.   Return to ED if symptoms worsen, persist, you develop fever, muffled hearing, drainage of your ear

## 2016-12-30 DIAGNOSIS — D81818 Other biotin-dependent carboxylase deficiency: Secondary | ICD-10-CM | POA: Diagnosis not present

## 2016-12-30 DIAGNOSIS — Z1389 Encounter for screening for other disorder: Secondary | ICD-10-CM | POA: Diagnosis not present

## 2016-12-30 DIAGNOSIS — I10 Essential (primary) hypertension: Secondary | ICD-10-CM | POA: Diagnosis not present

## 2016-12-30 DIAGNOSIS — I739 Peripheral vascular disease, unspecified: Secondary | ICD-10-CM | POA: Diagnosis not present

## 2016-12-30 DIAGNOSIS — I482 Chronic atrial fibrillation: Secondary | ICD-10-CM | POA: Diagnosis not present

## 2016-12-30 DIAGNOSIS — J449 Chronic obstructive pulmonary disease, unspecified: Secondary | ICD-10-CM | POA: Diagnosis not present

## 2016-12-30 DIAGNOSIS — R413 Other amnesia: Secondary | ICD-10-CM | POA: Diagnosis not present

## 2017-01-28 DIAGNOSIS — H6123 Impacted cerumen, bilateral: Secondary | ICD-10-CM | POA: Diagnosis not present

## 2017-01-28 DIAGNOSIS — H6501 Acute serous otitis media, right ear: Secondary | ICD-10-CM | POA: Diagnosis not present

## 2017-02-20 DIAGNOSIS — R300 Dysuria: Secondary | ICD-10-CM | POA: Diagnosis not present

## 2017-02-20 DIAGNOSIS — R109 Unspecified abdominal pain: Secondary | ICD-10-CM | POA: Diagnosis not present

## 2017-02-23 ENCOUNTER — Emergency Department (HOSPITAL_COMMUNITY): Payer: Medicare Other

## 2017-02-23 ENCOUNTER — Encounter (HOSPITAL_COMMUNITY): Payer: Self-pay | Admitting: *Deleted

## 2017-02-23 ENCOUNTER — Other Ambulatory Visit: Payer: Self-pay

## 2017-02-23 ENCOUNTER — Inpatient Hospital Stay (HOSPITAL_COMMUNITY)
Admission: EM | Admit: 2017-02-23 | Discharge: 2017-02-25 | DRG: 305 | Disposition: A | Discharge status: Home / Self Care | Payer: Medicare Other | Attending: Internal Medicine | Admitting: Internal Medicine

## 2017-02-23 DIAGNOSIS — I480 Paroxysmal atrial fibrillation: Secondary | ICD-10-CM | POA: Diagnosis not present

## 2017-02-23 DIAGNOSIS — I10 Essential (primary) hypertension: Secondary | ICD-10-CM | POA: Diagnosis present

## 2017-02-23 DIAGNOSIS — I700 Atherosclerosis of aorta: Secondary | ICD-10-CM | POA: Diagnosis not present

## 2017-02-23 DIAGNOSIS — N183 Chronic kidney disease, stage 3 (moderate): Secondary | ICD-10-CM | POA: Diagnosis not present

## 2017-02-23 DIAGNOSIS — I160 Hypertensive urgency: Principal | ICD-10-CM | POA: Diagnosis present

## 2017-02-23 DIAGNOSIS — Z8249 Family history of ischemic heart disease and other diseases of the circulatory system: Secondary | ICD-10-CM

## 2017-02-23 DIAGNOSIS — L8990 Pressure ulcer of unspecified site, unspecified stage: Secondary | ICD-10-CM

## 2017-02-23 DIAGNOSIS — E875 Hyperkalemia: Secondary | ICD-10-CM | POA: Diagnosis present

## 2017-02-23 DIAGNOSIS — I712 Thoracic aortic aneurysm, without rupture, unspecified: Secondary | ICD-10-CM | POA: Diagnosis present

## 2017-02-23 DIAGNOSIS — Z833 Family history of diabetes mellitus: Secondary | ICD-10-CM

## 2017-02-23 DIAGNOSIS — I129 Hypertensive chronic kidney disease with stage 1 through stage 4 chronic kidney disease, or unspecified chronic kidney disease: Secondary | ICD-10-CM | POA: Diagnosis present

## 2017-02-23 DIAGNOSIS — I779 Disorder of arteries and arterioles, unspecified: Secondary | ICD-10-CM | POA: Diagnosis present

## 2017-02-23 DIAGNOSIS — R0902 Hypoxemia: Secondary | ICD-10-CM | POA: Diagnosis present

## 2017-02-23 DIAGNOSIS — J449 Chronic obstructive pulmonary disease, unspecified: Secondary | ICD-10-CM | POA: Diagnosis present

## 2017-02-23 DIAGNOSIS — L89152 Pressure ulcer of sacral region, stage 2: Secondary | ICD-10-CM | POA: Diagnosis not present

## 2017-02-23 DIAGNOSIS — Z8349 Family history of other endocrine, nutritional and metabolic diseases: Secondary | ICD-10-CM

## 2017-02-23 DIAGNOSIS — S299XXA Unspecified injury of thorax, initial encounter: Secondary | ICD-10-CM | POA: Diagnosis not present

## 2017-02-23 DIAGNOSIS — I739 Peripheral vascular disease, unspecified: Secondary | ICD-10-CM

## 2017-02-23 DIAGNOSIS — I2510 Atherosclerotic heart disease of native coronary artery without angina pectoris: Secondary | ICD-10-CM | POA: Diagnosis present

## 2017-02-23 DIAGNOSIS — Z950 Presence of cardiac pacemaker: Secondary | ICD-10-CM

## 2017-02-23 DIAGNOSIS — Z9181 History of falling: Secondary | ICD-10-CM

## 2017-02-23 DIAGNOSIS — Z7902 Long term (current) use of antithrombotics/antiplatelets: Secondary | ICD-10-CM

## 2017-02-23 DIAGNOSIS — I701 Atherosclerosis of renal artery: Secondary | ICD-10-CM | POA: Diagnosis present

## 2017-02-23 DIAGNOSIS — I513 Intracardiac thrombosis, not elsewhere classified: Secondary | ICD-10-CM | POA: Diagnosis present

## 2017-02-23 DIAGNOSIS — Z87891 Personal history of nicotine dependence: Secondary | ICD-10-CM

## 2017-02-23 DIAGNOSIS — Z66 Do not resuscitate: Secondary | ICD-10-CM | POA: Diagnosis present

## 2017-02-23 DIAGNOSIS — I495 Sick sinus syndrome: Secondary | ICD-10-CM | POA: Diagnosis present

## 2017-02-23 HISTORY — DX: Aortic aneurysm of unspecified site, without rupture: I71.9

## 2017-02-23 LAB — COMPREHENSIVE METABOLIC PANEL
ALT: 7 U/L — AB (ref 14–54)
AST: 29 U/L (ref 15–41)
Albumin: 3.5 g/dL (ref 3.5–5.0)
Alkaline Phosphatase: 121 U/L (ref 38–126)
Anion gap: 8 (ref 5–15)
BUN: 31 mg/dL — ABNORMAL HIGH (ref 6–20)
CHLORIDE: 108 mmol/L (ref 101–111)
CO2: 23 mmol/L (ref 22–32)
Calcium: 9.8 mg/dL (ref 8.9–10.3)
Creatinine, Ser: 1.54 mg/dL — ABNORMAL HIGH (ref 0.44–1.00)
GFR, EST AFRICAN AMERICAN: 33 mL/min — AB (ref >60)
GFR, EST NON AFRICAN AMERICAN: 28 mL/min — AB (ref >60)
Glucose, Bld: 103 mg/dL — ABNORMAL HIGH (ref 65–99)
POTASSIUM: 5.7 mmol/L — AB (ref 3.5–5.1)
Sodium: 139 mmol/L (ref 135–145)
Total Bilirubin: 0.5 mg/dL (ref 0.3–1.2)
Total Protein: 7 g/dL (ref 6.5–8.1)

## 2017-02-23 LAB — CBC
HEMATOCRIT: 39.2 % (ref 36.0–46.0)
HEMOGLOBIN: 12.1 g/dL (ref 12.0–15.0)
MCH: 31.1 pg (ref 26.0–34.0)
MCHC: 30.9 g/dL (ref 30.0–36.0)
MCV: 100.8 fL — AB (ref 78.0–100.0)
Platelets: 237 10*3/uL (ref 150–400)
RBC: 3.89 MIL/uL (ref 3.87–5.11)
RDW: 13.3 % (ref 11.5–15.5)
WBC: 9 10*3/uL (ref 4.0–10.5)

## 2017-02-23 LAB — URINALYSIS, ROUTINE W REFLEX MICROSCOPIC
Bilirubin Urine: NEGATIVE
GLUCOSE, UA: NEGATIVE mg/dL
Hgb urine dipstick: NEGATIVE
Ketones, ur: NEGATIVE mg/dL
LEUKOCYTES UA: NEGATIVE
NITRITE: NEGATIVE
PH: 5 (ref 5.0–8.0)
Protein, ur: NEGATIVE mg/dL
SPECIFIC GRAVITY, URINE: 1.016 (ref 1.005–1.030)

## 2017-02-23 MED ORDER — MORPHINE SULFATE (PF) 4 MG/ML IV SOLN: 4.0000 mg | Freq: Once | INTRAVENOUS | Status: DC

## 2017-02-23 MED ORDER — ONDANSETRON HCL 4 MG/2ML IJ SOLN: 4.0000 mg | Freq: Once | INTRAMUSCULAR | Status: DC

## 2017-02-23 MED ORDER — CLONIDINE HCL 0.1 MG PO TABS
0.1000 mg | ORAL_TABLET | Freq: Once | ORAL | Status: AC
  Administered 2017-02-23: 0.1 mg via ORAL
  Filled 2017-02-23: qty 1

## 2017-02-23 MED ORDER — IOPAMIDOL (ISOVUE-370) INJECTION 76%
INTRAVENOUS | Status: AC
  Filled 2017-02-23: qty 100

## 2017-02-23 MED ORDER — SODIUM CHLORIDE 0.9 % IV BOLUS (SEPSIS)
500.0000 mL | Freq: Once | INTRAVENOUS | Status: AC
  Administered 2017-02-23: 500 mL via INTRAVENOUS

## 2017-02-23 MED ORDER — KETOROLAC TROMETHAMINE 30 MG/ML IJ SOLN: 30.0000 mg | Freq: Once | INTRAMUSCULAR | Status: DC

## 2017-02-23 NOTE — ED Notes (Signed)
Pt ambulatory to the restroom with 1 staff standby

## 2017-02-23 NOTE — ED Triage Notes (Addendum)
Pt reports having a fall approx 3 weeks ago but felt fine. Now has left side back pain. Went to an ucc for her pain on Saturday, had negative UA. Pt denies fever or urinary symptoms. Pt is hypertensive at triage, state she took her meds as prescribed.

## 2017-02-23 NOTE — ED Notes (Signed)
Pt in CT.

## 2017-02-23 NOTE — ED Notes (Signed)
Pt reports hypertension today, [facility] she reports BP of 140s systolic. Pt only taking one of the three BP meds she is suppose to take, due to choice

## 2017-02-23 NOTE — ED Provider Notes (Signed)
[facility] Provider Note   CSN: [id number] Arrival date & time: 02/23/17  1751     History   Chief Complaint Chief Complaint  Patient presents with  . Back Pain  . Hypertension    HPI [name] [name] is a 83 y.o. female.  HPI 83 year old Caucasian female past medical history significant for CKD, COPD, hypertension, mesenteric ischemia CAD presents to the emergency department today with complaints of left flank pain.  The patient's daughter is accompanying her.  States that approximately 3 weeks ago patient had a mechanical fall and landed on the floor her left ribs.  Patient states that she evaluated her after the fall and had no significant signs of trauma.  The patient states that the pain started in the left flank 1 week ago.  Went to urgent care approximately 1 week ago.  At that time did not do any imaging but did lab work.  Encourage patient to take Tylenol around-the-clock and use a heating pad.  The patient's daughter states that she was improving however yesterday she started complaining of pain in her left side again.  The patient states the pain is worse with ambulation and movement and palpation.  Patient denies any other associated symptoms including urinary symptoms, abdominal pain, change in bowel habits, urinary symptoms, chest pain, shortness of breath.  Daughter also states that patient has been battling high blood pressure for the past 3 months.  Patient with history of hypertension and has been on 3 hypertensive occasions in the past.  States approximately 1 year ago the patient stopped taking 2 of the 3 blood pressure medicine that she was supposed to be on because she was feeling better.  Patient blood pressure has been running high for the past 3 months.  Patient denies any associated headache, vision changes, lightheadedness.  Daughter does note that patient will complain of intermittent dizziness but denies any at this time.  No  associated chest pain or shortness of breath.  Patient with history of tobacco use.  The patient was a 60+ pack year smoker.  Stopped approximately 5 years ago.   Pt denies any fever, chill, ha, vision changes, lightheadedness, dizziness, congestion, neck pain, cp, sob, cough, abd pain, n/v/d, urinary symptoms, change in bowel habits, melena, hematochezia, lower extremity paresthesias.  Past Medical History:  Diagnosis Date  . Anemia   . Chronic kidney disease   . 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 COPD (chronic obstructive pulmonary disease) (HCC)   . Diverticulosis   . Hearing difficulty of left ear   . Hypertension   . Mesenteric ischemia, chronic (HCC)   . Pacemaker battery depletion   . Paroxysmal supraventricular tachycardia (HCC)   . SBO (small bowel obstruction) (HCC)    history partial  . Tachy-brady syndrome (HCC)    pacemaker-placed about 6 yrs ago.    Patient Active Problem List   Diagnosis Date Noted  . Paroxysmal atrial fibrillation (HCC) 10/19/2013  . Chronic diastolic heart failure (HCC) 10/19/2013  . Essential hypertension 10/19/2013  . History of permanent cardiac pacemaker placement 10/19/2013  . Tachy-brady syndrome (HCC)   . Renal artery atherosclerosis, unilateral (HCC) 04/19/2011  . Anemia associated with acute blood loss 04/18/2011  . COPD (chronic obstructive pulmonary disease) with chronic bronchitis (HCC) 04/18/2011  . Oral ulcer 04/18/2011  . Atherosclerosis of native arteries of extremity with intermittent claudication (HCC) 04/03/2011  . Peripheral arterial occlusive disease (HCC) 08/29/2010  . Chronic  mesenteric ischemia (HCC) 08/29/2010    Past Surgical History:  Procedure Laterality Date  . ABDOMINAL HYSTERECTOMY    . APPENDECTOMY    . ILIAC ARTERY STENT  07/30/10   Left CIA with angiogram  . PACEMAKER GENERATOR CHANGE N/A 09/18/2013   Procedure: PACEMAKER GENERATOR CHANGE;  Surgeon: Duke Salvia, MD;  Location:  [facility] CATH LAB;  Service: Cardiovascular;  Laterality: N/A;  . PACEMAKER INSERTION      OB History    No data available       Home Medications    Prior to Admission medications   Medication Sig Start Date End Date Taking? Authorizing Provider  amLODipine (NORVASC) 10 MG tablet TAKE 1 TABLET (10 MG TOTAL) BY MOUTH EVERY MORNING. Patient taking differently: Take 10 mg by mouth once a day 09/25/14   Lyn Records, MD  atenolol (TENORMIN) 50 MG tablet Take 50 mg by mouth every morning.     [provider]  Calcium Citrate-Vitamin D (CALCIUM CITRATE + D3 PO) Take by mouth.    [provider]  citalopram (CELEXA) 10 MG tablet Take 10 mg by mouth daily.    [provider]  clopidogrel (PLAVIX) 75 MG tablet Take 1 tablet (75 mg total) by mouth daily. 04/12/14   Fransisco Hertz, MD  [facility] IRON 325 (65 FE) MG tablet  05/26/14   [provider]  Cyanocobalamin (B-12 PO) Take by mouth.    [provider]  diphenhydramine-acetaminophen (TYLENOL PM) 25-500 MG TABS tablet Take 2 tablets by mouth at bedtime as needed.    [provider]  ferrous fumarate (HEMOCYTE - 106 MG FE) 325 (106 FE) MG TABS tablet Take 1 tablet by mouth 3 (three) times a week. Take Monday, Wednesday, Friday    [provider]  hydrochlorothiazide (MICROZIDE) 12.5 MG capsule TAKE 1 CAPSULE BY MOUTH DAILY. Patient taking differently: Take 12.5 mg by mouth once a day 01/01/15   Duke Salvia, MD  ipratropium (ATROVENT) 0.03 % nasal spray  04/23/14   [provider]  loratadine (ALLERGY) 10 MG tablet Take 10 mg by mouth as needed for allergies.    [provider]  losartan (COZAAR) 100 MG tablet Take 100 mg by mouth every morning.     [provider]  oxyCODONE-acetaminophen (PERCOCET/ROXICET) 5-325 MG per tablet Take 0.5 tablets by mouth at bedtime.  08/07/13   [provider]    Family History Family History  Problem Relation Age of Onset    . Cancer Brother   . Diabetes Brother   . Heart disease Brother   . Hyperlipidemia Brother     Social History Social History   Tobacco Use  . Smoking status: Former Smoker    Packs/day: 1.00    Years: 65.00    Pack years: 65.00    Types: Cigarettes    Last attempt to quit: 06/22/2010    Years since quitting: 6.6  . Smokeless tobacco: Never Used  Substance Use Topics  . Alcohol use: No  . Drug use: No     Allergies   Patient has no known allergies.   Review of Systems Review of Systems  Constitutional: Negative for chills and fever.  HENT: Negative for congestion.   Eyes: Negative for visual disturbance.  Respiratory: Negative for cough and shortness of breath.   Cardiovascular: Negative for chest pain.  Gastrointestinal: Negative for abdominal pain, diarrhea, nausea and vomiting.  Genitourinary: Positive for flank pain. Negative for dysuria, frequency, hematuria  and urgency.  Musculoskeletal: Positive for arthralgias and myalgias.  Skin: Negative for rash.  Neurological: Positive for dizziness. Negative for syncope, weakness, light-headedness, numbness and headaches.  Psychiatric/Behavioral: Negative for sleep disturbance. The patient is not nervous/anxious.      Physical Exam Updated Vital Signs BP (!) 213/106   Pulse 92   Temp (!) 97.4 F (36.3 C) (Oral)   Resp 16   SpO2 93%   Physical Exam  Constitutional: She is oriented to person, place, and time. She appears well-developed and well-nourished.  Non-toxic appearance. No distress.  HENT:  Head: Normocephalic and atraumatic.  Nose: Nose normal.  Mouth/Throat: Oropharynx is clear and moist.  Eyes: Conjunctivae and EOM are normal. Pupils are equal, round, and reactive to light. Right eye exhibits no discharge. Left eye exhibits no discharge.  Neck: Normal range of motion. Neck supple.  Cardiovascular: Normal rate, regular rhythm, normal heart sounds and intact distal pulses. Exam reveals no gallop and no  friction rub.  No murmur heard. Pulmonary/Chest: Effort normal and breath sounds normal. No stridor. No respiratory distress. She has no wheezes. She has no rales. She exhibits no tenderness.  Mild hypoxia noted 89-95%  Abdominal: Soft. Bowel sounds are normal. She exhibits no pulsatile midline mass. There is no tenderness. There is no rigidity, no rebound, no guarding, no CVA tenderness, no tenderness at McBurney's point and negative Murphy's sign.  Musculoskeletal: Normal range of motion. She exhibits no tenderness.       Arms: Patient is tender to palpation over the left flank specifically the left rib cage.  She does have 2 small abrasions noted from patient scratching the area.  Pain is worse with palpation.  No obvious deformity, step-offs, erythema.  No ecchymosis.  Full range of motion.  No midline L-spine or T-spine tenderness.  Lymphadenopathy:    She has no cervical adenopathy.  Neurological: She is alert and oriented to person, place, and time.  Skin: Skin is warm and dry. Capillary refill takes less than 2 seconds.  Psychiatric: Her behavior is normal. Judgment and thought content normal.  Nursing note and vitals reviewed.    ED Treatments / Results  Labs (all labs ordered are listed, but only abnormal results are displayed) Labs Reviewed  COMPREHENSIVE METABOLIC PANEL - Abnormal; Notable for the following components:      Result Value   Potassium 5.7 (*)    Glucose, Bld 103 (*)    BUN 31 (*)    Creatinine, Ser 1.54 (*)    ALT 7 (*)    GFR calc non Af Amer 28 (*)    GFR calc Af Amer 33 (*)    All other components within normal limits  CBC - Abnormal; Notable for the following components:   MCV 100.8 (*)    All other components within normal limits  URINALYSIS, ROUTINE W REFLEX MICROSCOPIC - Abnormal; Notable for the following components:   Color, Urine STRAW (*)    All other components within normal limits    EKG  EKG Interpretation None       Radiology Dg  Ribs Unilateral W/chest Left  Result Date: 02/23/2017 CLINICAL DATA:  Larey Seat approximately 3 weeks ago, now having LEFT side back pain, hypertensive EXAM: LEFT RIBS AND CHEST - 3+ VIEW COMPARISON:  03/05/2013 RIGHT rib radiographs and chest radiograph FINDINGS: LEFT subclavian sequential pacemaker with leads projecting over RIGHT atrium and RIGHT ventricle. Minimal enlargement of cardiac silhouette. Calcified tortuous thoracic aorta. New RIGHT basilar infiltrate. Underlying emphysematous changes.  Chronic accentuation of LEFT basilar markings. No pleural effusion or pneumothorax. Bones appear demineralized. No definite LEFT rib fracture or bone destruction. IMPRESSION: No acute LEFT rib abnormalities. Enlargement of cardiac silhouette post pacemaker. New RIGHT basilar infiltrate question pneumonia. Followup PA and lateral chest X-ray is recommended in 3-4 weeks following trial of antibiotic therapy to ensure resolution and exclude underlying malignancy. Electronically Signed   By: Ulyses Southward M.D.   On: 02/23/2017 22:27   Ct Angio Chest/abd/pel For Dissection W And/or W/wo  Result Date: 02/24/2017 CLINICAL DATA:  Mid back pain.  Hypoxia. EXAM: CT ANGIOGRAPHY CHEST, ABDOMEN AND PELVIS TECHNIQUE: Multidetector CT imaging through the chest, abdomen and pelvis was performed using the standard protocol during bolus administration of intravenous contrast. Multiplanar reconstructed images and MIPs were obtained and reviewed to evaluate the vascular anatomy. CONTRAST:  Isovue 370 intravenously. COMPARISON:  Chest radiograph 02/23/2017 FINDINGS: CTA CHEST FINDINGS Cardiovascular: Mildly enlarged heart. No pericardial effusion. Calcific atherosclerotic disease of the coronary arteries. Heavy calcific atherosclerotic disease of the aorta with calcified and noncalcified plaque. Aneurysmal dilation of the distal thoracic aorta with the lumen measuring 5.1 x 5.4 cm. Large amount of intramural clot along the medial posterior  wall of the aneurysmal aorta. Small intimal flap seen in the superior-most portion and the aneurysmally dilated thoracic aorta, image 72/ 92, sequence 8, at the site of probable ulcerated atherosclerotic plaque. Mediastinum/Nodes: No enlarged mediastinal, hilar, or axillary lymph nodes. Thyroid gland, trachea, and esophagus demonstrate no significant findings. Lungs/Pleura: Chronic interstitial lung changes. Bilateral apical pleural-parenchymal scarring. Partial right middle lobe collapse. Musculoskeletal: No evidence of fracture. Review of the MIP images confirms the above findings. CTA ABDOMEN AND PELVIS FINDINGS VASCULAR Aorta: The aneurysmal thoracic aorta extends to the superior abdomen, where it measures 2.6 x 2.8 cm. Calcified and noncalcified atherosclerotic plaque throughout. Fusiform inferior abdominal aortic aneurysm measures 2.4 x 2.3 cm, image 150/292, sequence 8. 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 SMA: Proximal SMA is occluded, with reconstitution of flow 2.3 cm distal to the origin of the vessel from collateral arteries from the celiac axis, image 130/292, axial images and image 93/169, sequence 12 sagittal images. Renals: Diminutive appearance of the right renal artery due to atherosclerotic plaque at its origin. Normal caliber of the left renal artery. IMA: Patent. Inflow: Patent without evidence of aneurysm, dissection, vasculitis or significant stenosis. Veins: No obvious venous abnormality within the limitations of this arterial phase study. Review of the MIP images confirms the above findings. NON-VASCULAR Hepatobiliary: No focal liver abnormality is seen. No gallstones, gallbladder wall thickening, or biliary dilatation. Tiny gallbladder stone dependently. Pancreas: Unremarkable. No pancreatic ductal dilatation or surrounding inflammatory changes. Spleen: Normal in size without focal abnormality. Adrenals/Urinary Tract:  Normal adrenal glands and left kidney. Small right kidney. Stomach/Bowel: Stomach is within normal limits. No evidence of bowel wall thickening, distention, or inflammatory changes. Scattered colonic diverticulosis. Lymphatic: Aortic atherosclerosis. No enlarged abdominal or pelvic lymph nodes. Reproductive: Status post hysterectomy. No adnexal masses. Other: No abdominal wall hernia or abnormality. No abdominopelvic ascites. Musculoskeletal: Multilevel osteoarthritic changes. Osteopenia. No evidence of pathologic fractures. Review of the MIP images confirms the above findings. IMPRESSION: Advance atherosclerotic disease of the aorta, with fusiform aneurysmal dilation of the distal thoracic aorta, measuring 5.4 cm in greatest transverse dimension, with large amount of intramural thrombus. Small penetrating ulcer at the superior portion of the aneurysmally dilated aorta. Fusiform aneurysmal dilation of the infrarenal abdominal  aorta measuring 2.4 cm in greatest transverse dimension. 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 Diminutive right renal artery due to atherosclerotic disease, with small size of the right kidney, likely due to hypoperfusion. Chronic interstitial lung changes with partial collapse of the right middle lobe. Electronically Signed   By: Ted Mcalpine M.D.   On: 02/24/2017 01:16    Procedures Procedures (including critical care time)  Medications Ordered in ED Medications  iopamidol (ISOVUE-370) 76 % injection (not administered)  carvedilol (COREG) tablet 6.25 mg (not administered)  hydrALAZINE (APRESOLINE) injection 10 mg (not administered)  cloNIDine (CATAPRES) tablet 0.1 mg (0.1 mg Oral Given 02/23/17 2219)  sodium chloride 0.9 % bolus 500 mL (0 mLs Intravenous Stopped 02/24/17 0058)  calcium gluconate 1 g in sodium chloride 0.9 % 100 mL IVPB (1 g Intravenous New Bag/Given 02/24/17 0548)    sodium bicarbonate injection 50 mEq (50 mEq Intravenous Given 02/24/17 0548)  sodium polystyrene (KAYEXALATE) 15 GM/60ML suspension 15 g (15 g Oral Given 02/24/17 0548)     Initial Impression / Assessment and Plan / ED Course  I have reviewed the triage vital signs and the nursing notes.  Pertinent labs & imaging results that were available during my care of the patient were reviewed by me and considered in my medical decision making (see chart for details).     The patient presents to the ED for evaluation of left flank pain after mechanical fall 3 weeks ago.  Patient also noted to be hypertensive with history of same and not taking her medications as prescribed.  On exam patient was also noted to be slightly tachycardic and hypoxic.  Patient with a history of COPD and multi pack-year smoker.  Lungs clear to auscultation bilaterally.  Abdominal exam was benign.  Blood pressure systolic 220 initially in the ED.  Patient given p.o. clonidine with improvement in her blood pressures.  Given patient's significant artery disease with hypertension and thoracic back pain concern for possible dissection.  Patient's creatinine of 1.5 appears to be at her baseline.  Her GFR is decreased.  Radiology tech spoke with the radiologist who will allow for patient to have dissection study.  Dissection study performed shows a thoracic aortic aneurysm of 5.4 cm with a penetrating ulcer.  I spoke with Dr. Claudie Fisherman with vascular surgery who is patient's vascular surgeon.  States that this patient is well-known to him.  States that she is being managed medically because she cannot be cleared for surgery due to her significant comorbidities.  States there is no significant surgical intervention that can be performed for patient at this time.  States that given patient's elevated blood pressure and poor blood pressure control she would benefit from admission for blood pressure control.  I did speak with Dr. Selena Batten with hospital  medicine who agrees to admission and will see patient in the ED for evaluation.  The patient remains hemodynamically stable this time.  Blood pressures had improved after the clonidine however the blood pressures are slightly increasing.  Patient denies any associated chest pain, shortness of breath, abdominal pain.  Patient and family have been updated on plan of care.  Patient was also seen and evaluated with my attending Dr. Fredderick Phenix who is agreed with the above plan.  Final Clinical Impressions(s) / ED Diagnoses   Final diagnoses:  Hypertensive urgency  Thoracic aortic aneurysm without rupture [facility])    ED Discharge Orders    None  Rise MuLeaphart, Esli Clements T, PA-C 02/24/17 0703    Rolan BuccoBelfi, Melanie, MD 02/24/17 [phone]

## 2017-02-23 NOTE — ED Notes (Signed)
ED Provider at bedside. 

## 2017-02-24 ENCOUNTER — Encounter (HOSPITAL_COMMUNITY): Payer: Self-pay | Admitting: Internal Medicine

## 2017-02-24 DIAGNOSIS — Z7902 Long term (current) use of antithrombotics/antiplatelets: Secondary | ICD-10-CM | POA: Diagnosis not present

## 2017-02-24 DIAGNOSIS — I712 Thoracic aortic aneurysm, without rupture, unspecified: Secondary | ICD-10-CM | POA: Diagnosis present

## 2017-02-24 DIAGNOSIS — I2510 Atherosclerotic heart disease of native coronary artery without angina pectoris: Secondary | ICD-10-CM | POA: Diagnosis present

## 2017-02-24 DIAGNOSIS — E875 Hyperkalemia: Secondary | ICD-10-CM

## 2017-02-24 DIAGNOSIS — N183 Chronic kidney disease, stage 3 (moderate): Secondary | ICD-10-CM | POA: Diagnosis present

## 2017-02-24 DIAGNOSIS — I129 Hypertensive chronic kidney disease with stage 1 through stage 4 chronic kidney disease, or unspecified chronic kidney disease: Secondary | ICD-10-CM | POA: Diagnosis present

## 2017-02-24 DIAGNOSIS — Z833 Family history of diabetes mellitus: Secondary | ICD-10-CM | POA: Diagnosis not present

## 2017-02-24 DIAGNOSIS — Z87891 Personal history of nicotine dependence: Secondary | ICD-10-CM | POA: Diagnosis not present

## 2017-02-24 DIAGNOSIS — I513 Intracardiac thrombosis, not elsewhere classified: Secondary | ICD-10-CM | POA: Diagnosis present

## 2017-02-24 DIAGNOSIS — R0902 Hypoxemia: Secondary | ICD-10-CM | POA: Diagnosis present

## 2017-02-24 DIAGNOSIS — Z8349 Family history of other endocrine, nutritional and metabolic diseases: Secondary | ICD-10-CM | POA: Diagnosis not present

## 2017-02-24 DIAGNOSIS — I739 Peripheral vascular disease, unspecified: Secondary | ICD-10-CM | POA: Diagnosis present

## 2017-02-24 DIAGNOSIS — Z9181 History of falling: Secondary | ICD-10-CM | POA: Diagnosis not present

## 2017-02-24 DIAGNOSIS — I480 Paroxysmal atrial fibrillation: Secondary | ICD-10-CM | POA: Diagnosis present

## 2017-02-24 DIAGNOSIS — I160 Hypertensive urgency: Principal | ICD-10-CM

## 2017-02-24 DIAGNOSIS — Z66 Do not resuscitate: Secondary | ICD-10-CM | POA: Diagnosis present

## 2017-02-24 DIAGNOSIS — L89152 Pressure ulcer of sacral region, stage 2: Secondary | ICD-10-CM | POA: Diagnosis present

## 2017-02-24 DIAGNOSIS — Z8249 Family history of ischemic heart disease and other diseases of the circulatory system: Secondary | ICD-10-CM | POA: Diagnosis not present

## 2017-02-24 DIAGNOSIS — I701 Atherosclerosis of renal artery: Secondary | ICD-10-CM | POA: Diagnosis present

## 2017-02-24 DIAGNOSIS — J449 Chronic obstructive pulmonary disease, unspecified: Secondary | ICD-10-CM | POA: Diagnosis present

## 2017-02-24 DIAGNOSIS — Z950 Presence of cardiac pacemaker: Secondary | ICD-10-CM | POA: Diagnosis not present

## 2017-02-24 DIAGNOSIS — L8990 Pressure ulcer of unspecified site, unspecified stage: Secondary | ICD-10-CM

## 2017-02-24 DIAGNOSIS — I495 Sick sinus syndrome: Secondary | ICD-10-CM | POA: Diagnosis present

## 2017-02-24 LAB — LIPID PANEL
CHOL/HDL RATIO: 2.4 ratio
Cholesterol: 113 mg/dL (ref 0–200)
HDL: 47 mg/dL (ref >40)
LDL CALC: 55 mg/dL (ref 0–99)
Triglycerides: 53 mg/dL (ref <150)
VLDL: 11 mg/dL (ref 0–40)

## 2017-02-24 LAB — CBC
HCT: 33.1 % — ABNORMAL LOW (ref 36.0–46.0)
Hemoglobin: 10.5 g/dL — ABNORMAL LOW (ref 12.0–15.0)
MCH: 31.9 pg (ref 26.0–34.0)
MCHC: 31.7 g/dL (ref 30.0–36.0)
MCV: 100.6 fL — ABNORMAL HIGH (ref 78.0–100.0)
PLATELETS: 172 10*3/uL (ref 150–400)
RBC: 3.29 MIL/uL — AB (ref 3.87–5.11)
RDW: 13.5 % (ref 11.5–15.5)
WBC: 5.9 10*3/uL (ref 4.0–10.5)

## 2017-02-24 LAB — COMPREHENSIVE METABOLIC PANEL
ALT: 8 U/L — AB (ref 14–54)
AST: 20 U/L (ref 15–41)
Albumin: 3 g/dL — ABNORMAL LOW (ref 3.5–5.0)
Alkaline Phosphatase: 104 U/L (ref 38–126)
Anion gap: 8 (ref 5–15)
BUN: 23 mg/dL — ABNORMAL HIGH (ref 6–20)
CO2: 27 mmol/L (ref 22–32)
Calcium: 9.4 mg/dL (ref 8.9–10.3)
Chloride: 104 mmol/L (ref 101–111)
Creatinine, Ser: 1.22 mg/dL — ABNORMAL HIGH (ref 0.44–1.00)
GFR calc non Af Amer: 38 mL/min — ABNORMAL LOW (ref >60)
GFR, EST AFRICAN AMERICAN: 44 mL/min — AB (ref >60)
Glucose, Bld: 109 mg/dL — ABNORMAL HIGH (ref 65–99)
POTASSIUM: 3.8 mmol/L (ref 3.5–5.1)
SODIUM: 139 mmol/L (ref 135–145)
TOTAL PROTEIN: 6.1 g/dL — AB (ref 6.5–8.1)
Total Bilirubin: 0.5 mg/dL (ref 0.3–1.2)

## 2017-02-24 MED ORDER — CARVEDILOL 6.25 MG PO TABS
6.2500 mg | ORAL_TABLET | Freq: Two times a day (BID) | ORAL | Status: DC
  Administered 2017-02-24 – 2017-02-25 (×3): 6.25 mg via ORAL
  Filled 2017-02-24 (×3): qty 1

## 2017-02-24 MED ORDER — LABETALOL HCL 5 MG/ML IV SOLN: 5.0000 mg | INTRAVENOUS | Status: DC | PRN

## 2017-02-24 MED ORDER — SODIUM CHLORIDE 0.9% FLUSH
3.0000 mL | Freq: Two times a day (BID) | INTRAVENOUS | Status: DC
  Administered 2017-02-24 – 2017-02-25 (×2): 3 mL via INTRAVENOUS

## 2017-02-24 MED ORDER — SODIUM BICARBONATE 8.4 % IV SOLN
50.0000 meq | Freq: Once | INTRAVENOUS | Status: AC
  Administered 2017-02-24: 50 meq via INTRAVENOUS
  Filled 2017-02-24: qty 50

## 2017-02-24 MED ORDER — HYDROCHLOROTHIAZIDE 12.5 MG PO CAPS
12.5000 mg | ORAL_CAPSULE | Freq: Every day | ORAL | Status: DC
  Administered 2017-02-24 – 2017-02-25 (×2): 12.5 mg via ORAL
  Filled 2017-02-24 (×2): qty 1

## 2017-02-24 MED ORDER — ATORVASTATIN CALCIUM 80 MG PO TABS
80.0000 mg | ORAL_TABLET | Freq: Every day | ORAL | Status: DC
  Administered 2017-02-24: 80 mg via ORAL
  Filled 2017-02-24 (×2): qty 1

## 2017-02-24 MED ORDER — ACETAMINOPHEN 650 MG RE SUPP: 650.0000 mg | Freq: Four times a day (QID) | RECTAL | Status: DC | PRN

## 2017-02-24 MED ORDER — ACETAMINOPHEN 325 MG PO TABS: 650.0000 mg | ORAL_TABLET | Freq: Four times a day (QID) | ORAL | Status: DC | PRN

## 2017-02-24 MED ORDER — SODIUM CHLORIDE 0.9 % IV SOLN: 250.0000 mL | INTRAVENOUS | Status: DC | PRN

## 2017-02-24 MED ORDER — HYDRALAZINE HCL 20 MG/ML IJ SOLN: 10.0000 mg | INTRAMUSCULAR | Status: DC | PRN

## 2017-02-24 MED ORDER — LOSARTAN POTASSIUM-HCTZ 100-12.5 MG PO TABS: 0.5000 | ORAL_TABLET | Freq: Every day | ORAL | Status: DC

## 2017-02-24 MED ORDER — SODIUM POLYSTYRENE SULFONATE 15 GM/60ML PO SUSP
15.0000 g | Freq: Once | ORAL | Status: AC
  Administered 2017-02-24: 15 g via ORAL
  Filled 2017-02-24: qty 60

## 2017-02-24 MED ORDER — AMLODIPINE BESYLATE 10 MG PO TABS
10.0000 mg | ORAL_TABLET | Freq: Every day | ORAL | Status: DC
  Administered 2017-02-24 – 2017-02-25 (×2): 10 mg via ORAL
  Filled 2017-02-24 (×2): qty 1

## 2017-02-24 MED ORDER — SODIUM CHLORIDE 0.9 % IV SOLN
1.0000 g | Freq: Once | INTRAVENOUS | Status: AC
  Administered 2017-02-24: 1 g via INTRAVENOUS
  Filled 2017-02-24 (×2): qty 10

## 2017-02-24 MED ORDER — SODIUM CHLORIDE 0.9% FLUSH: 3.0000 mL | INTRAVENOUS | Status: DC | PRN

## 2017-02-24 MED ORDER — DIPHENHYDRAMINE HCL 25 MG PO CAPS
25.0000 mg | ORAL_CAPSULE | Freq: Once | ORAL | Status: AC
  Administered 2017-02-25: 25 mg via ORAL
  Filled 2017-02-24: qty 1

## 2017-02-24 MED ORDER — HYDRALAZINE HCL 20 MG/ML IJ SOLN
10.0000 mg | Freq: Four times a day (QID) | INTRAMUSCULAR | Status: DC | PRN
  Filled 2017-02-24: qty 1

## 2017-02-24 MED ORDER — CLOPIDOGREL BISULFATE 75 MG PO TABS
75.0000 mg | ORAL_TABLET | Freq: Every day | ORAL | Status: DC
Start: 2017-02-24 — End: 2017-02-25
  Administered 2017-02-24 – 2017-02-25 (×2): 75 mg via ORAL
  Filled 2017-02-24 (×2): qty 1

## 2017-02-24 MED ORDER — TRAMADOL HCL 50 MG PO TABS: 50.0000 mg | ORAL_TABLET | Freq: Four times a day (QID) | ORAL | Status: DC | PRN

## 2017-02-24 NOTE — Progress Notes (Signed)
  PROGRESS NOTE  Patient admitted earlier this morning. See H&P. [name] [name] is a 83 yo female with past medical hx of CKD stage 3, Pafib, tachy-brady s/p pacer, PVD, mesenteric ischemia, apparently presents with c/o pain in the back left of midline. Pt apparently had mechanical fall 3 weeks ago.  Pt was brought to ED and BP was found to be elevated.  Pt had stopped 2 of 3 BP medications about a year ago.   CTA chest/abd/pelvis found 5.4 cm distal thoracic aorta with large amount of intramural thrombus. Per Dr. Imogene Burnhen, no surgical intervention is planned. He recommends medical management and follow up in office.  Norvasc 10mg , coreg 6.25mg  BID, HCTZ 12.5mg   Hold ARB due to hyperkalemia on admission  Plavix 75mg   BP better today Discussed with patient and daughter over the phone on importance of blood pressure management.   Noralee StainJennifer Javonni Macke, DO [facility] [email or web address] Password [email or web address] 02/24/2017, 2:56 PM

## 2017-02-24 NOTE — ED Notes (Signed)
Pt states she is in no pain and is ready to go home. Daughter bedside

## 2017-02-24 NOTE — Evaluation (Addendum)
Occupational Therapy Evaluation and Discharge Patient Details Name: [name] LatinHelen [name] MRN: [id number] DOB: 09/03/1923 Today's Date: 02/24/2017    History of Present Illness Pt is a 83 y/o female who presents with back pain. She was found to have a descending thoracic aortic aneurysm. PMH significant for CKD, COPD, HTN, mesenteric ischemia, tachy-brady syndrome s/p pacemaker ~2012.   Clinical Impression   PTA pt independent in ADL and mobility with RW. Pt is currently back at baseline and has good safety awareness (daughter confirms that her mom is very safety conscious at home) Pt able to demonstrate LB dressing, grooming, transfers, and modified independence in ADL/functional transfers. Education complete. No questions from Pt or daughter, OT to sign off at this time. Thank you for the opportunity to serve this patient.    Follow Up Recommendations  No OT follow up    Equipment Recommendations  None recommended by OT(Pt has appropriate DME - not interested in shower chair)    Recommendations for Other Services       Precautions / Restrictions Precautions Precautions: Fall Restrictions Weight Bearing Restrictions: No      Mobility Bed Mobility Overal bed mobility: Modified Independent Bed Mobility: Supine to Sit     Supine to sit: Modified independent (Device/Increase time)     General bed mobility comments: Increased time required - no use of rails  Transfers Overall transfer level: Modified independent Equipment used: Rolling walker (2 wheeled) Transfers: Sit to/from Stand           General transfer comment: With RW to hold to upon stand, pt able to power-up to full standing position without assist and without LOB.     Balance Overall balance assessment: Needs assistance Sitting-balance support: No upper extremity supported;Feet supported Sitting balance-Leahy Scale: Fair     Standing balance support: No upper extremity supported;During functional  activity Standing balance-Leahy Scale: Fair Standing balance comment: Static standing activity                           ADL either performed or assessed with clinical judgement   ADL Overall ADL's : Modified independent;At baseline                                             Vision Patient Visual Report: No change from baseline       Perception     Praxis      Pertinent Vitals/Pain Pain Assessment: No/denies pain     Hand Dominance Right   Extremity/Trunk Assessment Upper Extremity Assessment Upper Extremity Assessment: Overall WFL for tasks assessed;Generalized weakness   Lower Extremity Assessment Lower Extremity Assessment: Defer to PT evaluation   Cervical / Trunk Assessment Cervical / Trunk Assessment: Kyphotic   Communication     Cognition Arousal/Alertness: Awake/alert Behavior During Therapy: WFL for tasks assessed/performed Overall Cognitive Status: Within Functional Limits for tasks assessed                                 General Comments: A/O x4   General Comments  Pt's daughter present during session, and confirming that her mother is at baseline    Exercises     Shoulder Instructions      Home Living Family/patient expects to be discharged to:: Private residence   Available Help  at Discharge: Family Type of Home: House Home Access: Level entry     Home Layout: One level     Bathroom Shower/Tub: Producer, television/film/videoWalk-in shower   Bathroom Toilet: Standard     Home Equipment: Environmental consultantWalker - 2 wheels;Shower seat - built in          Prior Functioning/Environment Level of Independence: Independent with assistive device(s)        Comments: Pt reports she uses her walker most of the time        OT Problem List:        OT Treatment/Interventions:      OT Goals(Current goals can be found in the care plan section) Acute Rehab OT Goals Patient Stated Goal: Return home ALONE. Pt very vocal about wanting to  be independent and prefers to live alone OT Goal Formulation: With patient/family Time For Goal Achievement: 03/10/17 Potential to Achieve Goals: Good  OT Frequency:     Barriers to D/C:            Co-evaluation              AM-PAC PT "6 Clicks" Daily Activity     Outcome Measure Help from another person eating meals?: None Help from another person taking care of personal grooming?: None Help from another person toileting, which includes using toliet, bedpan, or urinal?: None Help from another person bathing (including washing, rinsing, drying)?: None Help from another person to put on and taking off regular upper body clothing?: None Help from another person to put on and taking off regular lower body clothing?: None 6 Click Score: 24   End of Session Equipment Utilized During Treatment: Gait belt;Rolling walker Nurse Communication: Mobility status  Activity Tolerance: Patient tolerated treatment well Patient left: in bed;with call bell/phone within [name];with family/visitor present                   Time: [id number] OT Time Calculation (min): 19 min Charges:  OT General Charges $OT Visit: 1 Visit OT Evaluation $OT Eval Low Complexity: 1 Low G-Codes:     Sherryl MangesLaura Isa [name] OTR/L [phone]  [name] BioLaura J [name] [name] 02/24/2017, 6:00 PM

## 2017-02-24 NOTE — H&P (Addendum)
TRH H&P   Patient Demographics:    Mearl LatinHelen Shere, is a 83 y.o. female  MRN: [id number]   DOB - 11/26/1923  Admit Date - 02/23/2017  Outpatient Primary MD for the patient is Marden NobleGates, Robert, MD  Referring MD/NP/PA:     Outpatient Specialists:  Dr. Imogene Burnhen (vascular)  Patient coming from: home  Chief Complaint  Patient presents with  . Back Pain  . Hypertension      HPI:    Mearl LatinHelen Maser  is a 83 y.o. female, w  CKD stage3,  Pafib , tachy-brady s/p pacer, PVD,Mesenteric ischemia, apparently prsents with c/o pain in the back left of midline. Pt apparently had mechanical fall 3 weeks ago.  Pt was brought to ED and bp was found to be elevated.  Pt had stopped 2 of 3  bp medications.   In ED,  CTA chest IMPRESSION: Advance atherosclerotic disease of the aorta, with fusiform aneurysmal dilation of the distal thoracic aorta, measuring 5.4 cm in greatest transverse dimension, with large amount of intramural thrombus.  Small penetrating ulcer at the superior portion of the aneurysmally dilated aorta.  Fusiform aneurysmal dilation of the infrarenal abdominal aorta measuring 2.4 cm in greatest transverse dimension.  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  Diminutive right renal artery due to atherosclerotic disease, with small size of the right kidney, likely due to hypoperfusion.  Chronic interstitial lung changes with partial collapse of the right middle lobe.  Na 139, K 5.7,  Bun 31, Creatinine 1.54 Ast 29, Alt 7  Wbc 9.0, Hgb 12.1, Plt 237  Per ED who spoke with Dr. Imogene Burnhen no surgery indicated for patient at this time  For aneurysm of penetrating ulcer Pt will be admitted for bp control    Review of systems:    In addition to the HPI above, No Fever-chills, No  Headache, No changes with Vision or hearing, No problems swallowing food or Liquids, No  Cough or Shortness of Breath, No Abdominal pain, No Nausea or Vommitting, Bowel movements are regular, No Blood in stool or Urine, No dysuria, No new skin rashes or bruises, No new joints pains-aches,  No new weakness, tingling, numbness in any extremity, No recent weight gain or loss, No polyuria, polydypsia or polyphagia, No significant Mental Stressors.  A full 10 point Review of Systems was done, except as stated above, all other Review of Systems were negative.   With Past History of the following :    Past Medical History:  Diagnosis Date  . Anemia   . Chronic kidney disease   . 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 COPD (chronic obstructive pulmonary disease) (HCC)   . Diverticulosis   . Hearing difficulty of left ear   . Hypertension   . Mesenteric ischemia, chronic (HCC)   .  Pacemaker battery depletion   . Paroxysmal supraventricular tachycardia (HCC)   . SBO (small bowel obstruction) (HCC)    history partial  . Tachy-brady syndrome (HCC)    pacemaker-placed about 6 yrs ago.      Past Surgical History:  Procedure Laterality Date  . ABDOMINAL HYSTERECTOMY    . APPENDECTOMY    . ILIAC ARTERY STENT  07/30/10   Left CIA with angiogram  . PACEMAKER GENERATOR CHANGE N/A 09/18/2013   Procedure: PACEMAKER GENERATOR CHANGE;  Surgeon: Duke Salvia, MD;  Location: [facility] CATH LAB;  Service: Cardiovascular;  Laterality: N/A;  . PACEMAKER INSERTION        Social History:     Social History   Tobacco Use  . Smoking status: Former Smoker    Packs/day: 1.00    Years: 65.00    Pack years: 65.00    Types: Cigarettes    Last attempt to quit: 06/22/2010    Years since quitting: 6.6  . Smokeless tobacco: Never Used  Substance Use Topics  . Alcohol use: No     Lives -  At home Mobility -  Walks by self    Family History :     Family History    Problem Relation Age of Onset  . Cancer Brother   . Diabetes Brother   . Heart disease Brother   . Hyperlipidemia Brother      Home Medications:   Prior to Admission medications   Medication Sig Start Date End Date Taking? Authorizing Provider  clopidogrel (PLAVIX) 75 MG tablet Take 1 tablet (75 mg total) by mouth daily. 04/12/14  Yes Fransisco Hertz, MD  diphenhydramine-acetaminophen (TYLENOL PM) 25-500 MG TABS tablet Take 1 tablet by mouth at bedtime.    Yes [provider]  losartan-hydrochlorothiazide (HYZAAR) 100-12.5 MG tablet Take 0.5 tablets by mouth daily. 01/25/17  Yes [provider]  amLODipine (NORVASC) 10 MG tablet TAKE 1 TABLET (10 MG TOTAL) BY MOUTH EVERY MORNING. Patient not taking: Reported on 02/23/2017 09/25/14   Lyn Records, MD  hydrochlorothiazide (MICROZIDE) 12.5 MG capsule TAKE 1 CAPSULE BY MOUTH DAILY. Patient not taking: Reported on 02/23/2017 01/01/15   Duke Salvia, MD     Allergies:    No Known Allergies   Physical Exam:   Vitals  Blood pressure (!) 171/66, pulse 83, temperature (!) 97.4 F (36.3 C), temperature source Oral, resp. rate (!) 23, SpO2 93 %.   1. General  lying in bed in NAD,    2. Normal affect and insight, Not Suicidal or Homicidal, Awake Alert, Oriented X 3.  3. No F.N deficits, ALL C.Nerves Intact, Strength 5/5 all 4 extremities, Sensation intact all 4 extremities, Plantars down going.  4. Ears and Eyes appear Normal, Conjunctivae clear, PERRLA. Moist Oral Mucosa.  5. Supple Neck, No JVD, No cervical lymphadenopathy appriciated, No Carotid Bruits.  6. Symmetrical Chest wall movement, Good air movement bilaterally, CTAB.  7. RRR, No Gallops, Rubs or Murmurs, No Parasternal Heave.  8. Positive Bowel Sounds, Abdomen Soft, No tenderness, No organomegaly appriciated,No rebound -guarding or rigidity.  9.  No Cyanosis, Normal Skin Turgor, No Skin Rash or Bruise.  10. Good muscle tone,  joints appear normal , no  effusions, Normal ROM.  11. No Palpable Lymph Nodes in Neck or Axillae   Pacer in chest   Data Review:    CBC Recent Labs  Lab 02/23/17 1828  WBC 9.0  HGB 12.1  HCT 39.2  PLT 237  MCV 100.8*  MCH 31.1  MCHC 30.9  RDW 13.3   ------------------------------------------------------------------------------------------------------------------  Chemistries  Recent Labs  Lab 02/23/17 1828  NA 139  K 5.7*  CL 108  CO2 23  GLUCOSE 103*  BUN 31*  CREATININE 1.54*  CALCIUM 9.8  AST 29  ALT 7*  ALKPHOS 121  BILITOT 0.5   ------------------------------------------------------------------------------------------------------------------ CrCl cannot be calculated (Unknown ideal weight.). ------------------------------------------------------------------------------------------------------------------ No results for input(s): TSH, T4TOTAL, T3FREE, THYROIDAB in the last 72 hours.  Invalid input(s): FREET3  Coagulation profile No results for input(s): INR, PROTIME in the last 168 hours. ------------------------------------------------------------------------------------------------------------------- No results for input(s): DDIMER in the last 72 hours. -------------------------------------------------------------------------------------------------------------------  Cardiac Enzymes No results for input(s): CKMB, TROPONINI, MYOGLOBIN in the last 168 hours.  Invalid input(s): CK ------------------------------------------------------------------------------------------------------------------ No results found for: BNP   ---------------------------------------------------------------------------------------------------------------  Urinalysis    Component Value Date/Time   COLORURINE STRAW (A) 02/23/2017 2120   APPEARANCEUR CLEAR 02/23/2017 2120   LABSPEC 1.016 02/23/2017 2120   PHURINE 5.0 02/23/2017 2120   GLUCOSEU NEGATIVE 02/23/2017 2120   HGBUR NEGATIVE  02/23/2017 2120   BILIRUBINUR NEGATIVE 02/23/2017 2120   KETONESUR NEGATIVE 02/23/2017 2120   PROTEINUR NEGATIVE 02/23/2017 2120   UROBILINOGEN 0.2 07/17/2011 1613   NITRITE NEGATIVE 02/23/2017 2120   LEUKOCYTESUR NEGATIVE 02/23/2017 2120    ----------------------------------------------------------------------------------------------------------------   Imaging Results:    Dg Ribs Unilateral W/chest Left  Result Date: 02/23/2017 CLINICAL DATA:  Larey SeatFell approximately 3 weeks ago, now having LEFT side back pain, hypertensive EXAM: LEFT RIBS AND CHEST - 3+ VIEW COMPARISON:  03/05/2013 RIGHT rib radiographs and chest radiograph FINDINGS: LEFT subclavian sequential pacemaker with leads projecting over RIGHT atrium and RIGHT ventricle. Minimal enlargement of cardiac silhouette. Calcified tortuous thoracic aorta. New RIGHT basilar infiltrate. Underlying emphysematous changes. Chronic accentuation of LEFT basilar markings. No pleural effusion or pneumothorax. Bones appear demineralized. No definite LEFT rib fracture or bone destruction. IMPRESSION: No acute LEFT rib abnormalities. Enlargement of cardiac silhouette post pacemaker. New RIGHT basilar infiltrate question pneumonia. Followup PA and lateral chest X-ray is recommended in 3-4 weeks following trial of antibiotic therapy to ensure resolution and exclude underlying malignancy. Electronically Signed   By: Ulyses SouthwardMark  Boles M.D.   On: 02/23/2017 22:27   Ct Angio Chest/abd/pel For Dissection W And/or W/wo  Result Date: 02/24/2017 CLINICAL DATA:  Mid back pain.  Hypoxia. EXAM: CT ANGIOGRAPHY CHEST, ABDOMEN AND PELVIS TECHNIQUE: Multidetector CT imaging through the chest, abdomen and pelvis was performed using the standard protocol during bolus administration of intravenous contrast. Multiplanar reconstructed images and MIPs were obtained and reviewed to evaluate the vascular anatomy. CONTRAST:  Isovue 370 intravenously. COMPARISON:  Chest radiograph 02/23/2017  FINDINGS: CTA CHEST FINDINGS Cardiovascular: Mildly enlarged heart. No pericardial effusion. Calcific atherosclerotic disease of the coronary arteries. Heavy calcific atherosclerotic disease of the aorta with calcified and noncalcified plaque. Aneurysmal dilation of the distal thoracic aorta with the lumen measuring 5.1 x 5.4 cm. Large amount of intramural clot along the medial posterior wall of the aneurysmal aorta. Small intimal flap seen in the superior-most portion and the aneurysmally dilated thoracic aorta, image 72/ 92, sequence 8, at the site of probable ulcerated atherosclerotic plaque. Mediastinum/Nodes: No enlarged mediastinal, hilar, or axillary lymph nodes. Thyroid gland, trachea, and esophagus demonstrate no significant findings. Lungs/Pleura: Chronic interstitial lung changes. Bilateral apical pleural-parenchymal scarring. Partial right middle lobe collapse. Musculoskeletal: No evidence of fracture. Review of the MIP images confirms the above findings. CTA ABDOMEN AND PELVIS FINDINGS VASCULAR Aorta: The aneurysmal thoracic  aorta extends to the superior abdomen, where it measures 2.6 x 2.8 cm. Calcified and noncalcified atherosclerotic plaque throughout. Fusiform inferior abdominal aortic aneurysm measures 2.4 x 2.3 cm, image 150/292, sequence 8. 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 SMA: Proximal SMA is occluded, with reconstitution of flow 2.3 cm distal to the origin of the vessel from collateral arteries from the celiac axis, image 130/292, axial images and image 93/169, sequence 12 sagittal images. Renals: Diminutive appearance of the right renal artery due to atherosclerotic plaque at its origin. Normal caliber of the left renal artery. IMA: Patent. Inflow: Patent without evidence of aneurysm, dissection, vasculitis or significant stenosis. Veins: No obvious venous abnormality within the limitations of this arterial phase  study. Review of the MIP images confirms the above findings. NON-VASCULAR Hepatobiliary: No focal liver abnormality is seen. No gallstones, gallbladder wall thickening, or biliary dilatation. Tiny gallbladder stone dependently. Pancreas: Unremarkable. No pancreatic ductal dilatation or surrounding inflammatory changes. Spleen: Normal in size without focal abnormality. Adrenals/Urinary Tract: Normal adrenal glands and left kidney. Small right kidney. Stomach/Bowel: Stomach is within normal limits. No evidence of bowel wall thickening, distention, or inflammatory changes. Scattered colonic diverticulosis. Lymphatic: Aortic atherosclerosis. No enlarged abdominal or pelvic lymph nodes. Reproductive: Status post hysterectomy. No adnexal masses. Other: No abdominal wall hernia or abnormality. No abdominopelvic ascites. Musculoskeletal: Multilevel osteoarthritic changes. Osteopenia. No evidence of pathologic fractures. Review of the MIP images confirms the above findings. IMPRESSION: Advance atherosclerotic disease of the aorta, with fusiform aneurysmal dilation of the distal thoracic aorta, measuring 5.4 cm in greatest transverse dimension, with large amount of intramural thrombus. Small penetrating ulcer at the superior portion of the aneurysmally dilated aorta. Fusiform aneurysmal dilation of the infrarenal abdominal aorta measuring 2.4 cm in greatest transverse dimension. 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 Diminutive right renal artery due to atherosclerotic disease, with small size of the right kidney, likely due to hypoperfusion. Chronic interstitial lung changes with partial collapse of the right middle lobe. Electronically Signed   By: Ted Mcalpine M.D.   On: 02/24/2017 01:16      Assessment & Plan:    Principal Problem:   Hypertensive urgency Active Problems:   Paroxysmal atrial fibrillation (HCC)    Hyperkalemia   Aortic aneurysm, thoracic (HCC)    Hypertensive urgency/uncontrolled Start carvedilol 6.25mg  po bid Amlodipine 10mg  po qday D/c Losartan due to hyperkalemia  Thoracic aortic aneurysm , w penetrating ulcer Check lipid Start lipitor 80mg  po qhs Check cbc in am Please contact vascular surgery in am  Hyperkalemia Calcium gluconate Sodium bicarbonate Kayexalate  PVD Cont plavix  CKD stage3 Check cmp in am   DVT Prophylaxis    SCDs  AM Labs Ordered, also please review Full Orders  Family Communication: Admission, patients condition and plan of care including tests being ordered have been discussed with the patient and daughter who indicate understanding and agree with the plan and Code Status.  Code Status DNR  Likely DC to  home  Condition GUARDED    Consults called:    Admission status: inpatient  Time spent in minutes : 45   Pearson Grippe M.D on 02/24/2017 at 3:49 AM  Between 7pm to 7am - Pager - [phone] . After 7am go to [email or web address] - password [facility]  [facility] - Office  [phone]

## 2017-02-24 NOTE — ED Notes (Signed)
Heart Healthy diet - grilled chicken ceasar salad - lunch tray ordered @ 1204.

## 2017-02-24 NOTE — ED Notes (Signed)
Assisted the patient to bathroom, she had an unsteady gait and needed hands on assistance.

## 2017-02-24 NOTE — Evaluation (Signed)
Physical Therapy Evaluation Patient Details Name: [name] [name] MRN: [id number] DOB: 12/22/1923 Today's Date: 02/24/2017   History of Present Illness  Pt is a 83 y/o female who presents with back pain. She was found to have a descending thoracic aortic aneurysm. PT consulted as pt was reported to be unsteady when ambulating in room. PMH significant for CKD, COPD, HTN, mesenteric ischemia, tachy-brady syndrome s/p pacemaker ~2012.  Clinical Impression  Patient evaluated by Physical Therapy with no further acute PT needs identified. All education has been completed and the patient has no further questions. Pt has been functioning independently with a RW in her home PTA, and states she is at baseline. At the time of PT eval pt was mobilizing at a mod I level with the RW for support. Pt reports no pain throughout session, and no LOB was observed with RW use. Pt was educated on safe mobility at home and accepting assist from family when needed, however pt adamant that she wants to live alone without family "coming in and out all the time". It does sound like the daughter is present often, however and assists with errands/groceries. See below for any follow-up Physical Therapy or equipment needs. PT is signing off. Thank you for this referral.     Follow Up Recommendations No PT follow up;Supervision for mobility/OOB    Equipment Recommendations  None recommended by PT    Recommendations for Other Services       Precautions / Restrictions Precautions Precautions: Fall Restrictions Weight Bearing Restrictions: No      Mobility  Bed Mobility Overal bed mobility: Modified Independent Bed Mobility: Supine to Sit     Supine to sit: Modified independent (Device/Increase time)     General bed mobility comments: Increased time required - no use of rails  Transfers Overall transfer level: Modified independent Equipment used: Rolling walker (2 wheeled) Transfers: Sit to/from Stand            General transfer comment: With RW to hold to upon stand, pt able to power-up to full standing position without assist and without LOB.   Ambulation/Gait Ambulation/Gait assistance: Modified independent (Device/Increase time) Ambulation Distance (Feet): 250 Feet Assistive device: Rolling walker (2 wheeled) Gait Pattern/deviations: Step-through pattern;Trunk flexed Gait velocity: Slow, but likely age appropriate   General Gait Details: Grossly flexed, however demonstrated slow, steady gait with the RW for support. Pt was cued for closer walker proximity. No LOB noted.   Stairs            Wheelchair Mobility    Modified Rankin (Stroke Patients Only)       Balance Overall balance assessment: Needs assistance Sitting-balance support: No upper extremity supported;Feet supported Sitting balance-Leahy Scale: Fair     Standing balance support: No upper extremity supported;During functional activity Standing balance-Leahy Scale: Fair Standing balance comment: Static standing activity                             Pertinent Vitals/Pain Pain Assessment: No/denies pain    Home Living Family/patient expects to be discharged to:: Private residence   Available Help at Discharge: Family Type of Home: House Home Access: Level entry     Home Layout: One level Home Equipment: Environmental consultantWalker - 2 wheels      Prior Function Level of Independence: Independent with assistive device(s)         Comments: Pt reports she uses her walker most of the time  Hand Dominance        Extremity/Trunk Assessment   Upper Extremity Assessment Upper Extremity Assessment: Defer to OT evaluation    Lower Extremity Assessment Lower Extremity Assessment: Generalized weakness(Likely age appropriate)    Cervical / Trunk Assessment Cervical / Trunk Assessment: Kyphotic  Communication      Cognition Arousal/Alertness: Awake/alert Behavior During Therapy: WFL for tasks  assessed/performed Overall Cognitive Status: Within Functional Limits for tasks assessed                                 General Comments: A/O x4      General Comments      Exercises     Assessment/Plan    PT Assessment Patent does not need any further PT services  PT Problem List         PT Treatment Interventions      PT Goals (Current goals can be found in the Care Plan section)  Acute Rehab PT Goals Patient Stated Goal: Return home ALONE. Pt very vocal about wanting to be independent and prefers to live alone PT Goal Formulation: All assessment and education complete, DC therapy    Frequency     Barriers to discharge        Co-evaluation               AM-PAC PT "6 Clicks" Daily Activity  Outcome Measure Difficulty turning over in bed (including adjusting bedclothes, sheets and blankets)?: None Difficulty moving from lying on back to sitting on the side of the bed? : None Difficulty sitting down on and standing up from a chair with arms (e.g., wheelchair, bedside commode, etc,.)?: None Help needed moving to and from a bed to chair (including a wheelchair)?: None Help needed walking in hospital room?: None Help needed climbing 3-5 steps with a railing? : A Lot 6 Click Score: 22    End of Session Equipment Utilized During Treatment: Gait belt;Oxygen Activity Tolerance: Patient tolerated treatment well Patient left: in chair;with call bell/phone within reach Nurse Communication: Mobility status PT Visit Diagnosis: Unsteadiness on feet (R26.81)    Time: [id number] PT Time Calculation (min) (ACUTE ONLY): 25 min   Charges:   PT Evaluation $PT Eval Moderate Complexity: 1 Mod PT Treatments $Gait Training: 8-22 mins   PT G Codes:        Conni SlipperLaura Amiley Shishido, PT, DPT Acute Rehabilitation Services Pager: [phone]   Marylynn PearsonLaura D Izekiel Flegel 02/24/2017, 12:22 PM

## 2017-02-24 NOTE — Progress Notes (Signed)
[name] LatinHelen [name] is a 83 y.o. female patient admitted from ED awake, alert - oriented  X 4 - no acute distress noted.  VSS - Blood pressure 119/76, pulse (!) 105, temperature 98 F (36.7 C), temperature source Oral, resp. rate 18, height 5\' 2"  (1.575 m), weight 54 kg (119 lb), SpO2 91 %.    IV in place, occlusive dsg intact without redness.  Orientation to room, and floor completed with information packet given to patient/family.  Patient declined safety video at this time.  Admission INP armband ID verified with patient/family, and in place.   SR up x 2, fall assessment complete, with patient and family able to verbalize understanding of risk associated with falls, and verbalized understanding to call nsg before up out of bed.  Call light within reach, patient able to voice, and demonstrate understanding.  Skin, clean-dry- pt has stage 2 on sacrum and multiple bruises on bilateral arms, and scratch marks on left back   Will cont to eval and treat per MD orders.  Eligah EastErin M Ephrata Verville, RN 02/24/2017 4:57 PM

## 2017-02-24 NOTE — Progress Notes (Signed)
   Daily Progress Note   [name] LatinHelen [name] is a 83 y.o. (08/24/1923) female is well known to me.  [facility] asked me to review the CTA chest.  The patient has develop a descending thoracic aortic aneurysm with associated PAU, maximal transverse diameter 5.4 cm (<5.5 cm threshold for repair).  Her iliac access vessels are extremely small, which would require an iliac conduit for access purposes.  Previously Cardiology has risk stratified her HIGH risk, hence, the conservative management of her CMI to date.  - Repairing her TAA would require a retroperitoneal exposure, iliac conduit and TEVAR.  I suspect she would be again be considered high risk for such. - No emergent need for intervention at this point. - Priority is BP control. - Follow up in office for further discussion.   Leonides SakeBrian Chen, MD, FACS [facility] Office: [phone] Pager: [phone]  02/24/2017, 9:17 AM

## 2017-02-24 NOTE — ED Notes (Signed)
Pt ambulatory to bathroom with steady gait; assisted by RN

## 2017-02-25 ENCOUNTER — Encounter (HOSPITAL_COMMUNITY): Payer: Self-pay | Admitting: General Practice

## 2017-02-25 LAB — CBC
HCT: 34.8 % — ABNORMAL LOW (ref 36.0–46.0)
Hemoglobin: 10.9 g/dL — ABNORMAL LOW (ref 12.0–15.0)
MCH: 31.1 pg (ref 26.0–34.0)
MCHC: 31.3 g/dL (ref 30.0–36.0)
MCV: 99.1 fL (ref 78.0–100.0)
PLATELETS: 201 10*3/uL (ref 150–400)
RBC: 3.51 MIL/uL — AB (ref 3.87–5.11)
RDW: 13.3 % (ref 11.5–15.5)
WBC: 8.5 10*3/uL (ref 4.0–10.5)

## 2017-02-25 LAB — BASIC METABOLIC PANEL
Anion gap: 7 (ref 5–15)
BUN: 16 mg/dL (ref 6–20)
CO2: 27 mmol/L (ref 22–32)
CREATININE: 1.16 mg/dL — AB (ref 0.44–1.00)
Calcium: 9.5 mg/dL (ref 8.9–10.3)
Chloride: 106 mmol/L (ref 101–111)
GFR calc Af Amer: 46 mL/min — ABNORMAL LOW (ref >60)
GFR, EST NON AFRICAN AMERICAN: 40 mL/min — AB (ref >60)
GLUCOSE: 93 mg/dL (ref 65–99)
Potassium: 3.4 mmol/L — ABNORMAL LOW (ref 3.5–5.1)
Sodium: 140 mmol/L (ref 135–145)

## 2017-02-25 MED ORDER — POTASSIUM CHLORIDE CRYS ER 20 MEQ PO TBCR
20.0000 meq | EXTENDED_RELEASE_TABLET | Freq: Once | ORAL | Status: AC
  Administered 2017-02-25: 20 meq via ORAL
  Filled 2017-02-25: qty 1

## 2017-02-25 MED ORDER — AMLODIPINE BESYLATE 10 MG PO TABS: 10.0000 mg | ORAL_TABLET | Freq: Every day | ORAL | 1 refills | Status: AC

## 2017-02-25 MED ORDER — ATORVASTATIN CALCIUM 80 MG PO TABS: 80.0000 mg | ORAL_TABLET | Freq: Every day | ORAL | 1 refills | Status: AC

## 2017-02-25 MED ORDER — CARVEDILOL 6.25 MG PO TABS: 6.2500 mg | ORAL_TABLET | Freq: Two times a day (BID) | ORAL | 1 refills | Status: AC

## 2017-02-25 MED ORDER — HYDROCHLOROTHIAZIDE 12.5 MG PO CAPS: 12.5000 mg | ORAL_CAPSULE | Freq: Every day | ORAL | 1 refills | Status: AC

## 2017-02-25 NOTE — Discharge Summary (Addendum)
Physician Discharge Summary  [name] [name] ZOX:[id number] DOB: 09/16/1923 DOA: 02/23/2017  PCP: Marden NobleGates, Robert, MD  Admit date: 02/23/2017 Discharge date: 02/25/2017  Admitted From: Home Disposition:  Home  Recommendations for Outpatient Follow-up:  1. Follow up with PCP in 1 week 2. Follow up with Dr. Imogene Burnhen, vascular surgery 3. Please obtain BMP in 1 week   Discharge Condition: Stable CODE STATUS: DNR  Diet recommendation: Heart healthy   Brief/Interim Summary: [name] [name] is a 83 yo female with past medical hx of CKD stage 3,Pafib, tachy-brady s/p pacer, PVD, mesenteric ischemia, apparently presents with c/o pain in the back left of midline. Pt apparently had mechanical fall 3 weeks ago. Pt was brought to ED and BP was found to be elevated. Pt had stopped 2 of 3 BP medications about a year ago. CTA chest/abd/pelvis found 5.4 cm distal thoracic aorta with large amount of intramural thrombus. Per Dr. Imogene Burnhen, no surgical intervention is planned. He recommends medical management and follow up in office. She was started on oral antihypertensives and BP improved. I discussed importance of BP med compliance with patient and daughter at length. I also recommended home BP monitor cuff.   Discharge Diagnoses:  Principal Problem:   Hypertensive urgency Active Problems:   Peripheral arterial occlusive disease (HCC)   Renal artery atherosclerosis, unilateral (HCC)   Paroxysmal atrial fibrillation (HCC)   Essential hypertension   Hyperkalemia   Aortic aneurysm, thoracic (HCC)   Pressure injury of skin Stage II Sacrum POA    Discharge Instructions  Discharge Instructions    Call MD for:  difficulty breathing, headache or visual disturbances   Complete by:  As directed    Call MD for:  extreme fatigue   Complete by:  As directed    Call MD for:  persistant dizziness or light-headedness   Complete by:  As directed    Call MD for:  persistant nausea and vomiting   Complete by:  As  directed    Call MD for:  severe uncontrolled pain   Complete by:  As directed    Call MD for:  temperature >100.4   Complete by:  As directed    Diet - low sodium heart healthy   Complete by:  As directed    Discharge instructions   Complete by:  As directed    You were cared for by a hospitalist during your hospital stay. If you have any questions about your discharge medications or the care you received while you were in the hospital after you are discharged, you can call the unit and asked to speak with the hospitalist on call if the hospitalist that took care of you is not available. Once you are discharged, your primary care physician will handle any further medical issues. Please note that NO REFILLS for any discharge medications will be authorized once you are discharged, as it is imperative that you return to your primary care physician (or establish a relationship with a primary care physician if you do not have one) for your aftercare needs so that they can reassess your need for medications and monitor your lab values.   Increase activity slowly   Complete by:  As directed      Allergies as of 02/25/2017   No Known Allergies     Medication List    STOP taking these medications   losartan-hydrochlorothiazide 100-12.5 MG tablet Commonly known as:  HYZAAR     TAKE these medications   amLODipine 10 MG tablet Commonly  known as:  NORVASC Take 1 tablet (10 mg total) by mouth daily. What changed:  See the new instructions.   atorvastatin 80 MG tablet Commonly known as:  LIPITOR Take 1 tablet (80 mg total) by mouth daily at 6 PM.   carvedilol 6.25 MG tablet Commonly known as:  COREG Take 1 tablet (6.25 mg total) by mouth 2 (two) times daily with a meal.   clopidogrel 75 MG tablet Commonly known as:  PLAVIX Take 1 tablet (75 mg total) by mouth daily.   diphenhydramine-acetaminophen 25-500 MG Tabs tablet Commonly known as:  TYLENOL PM Take 1 tablet by mouth at bedtime.    hydrochlorothiazide 12.5 MG capsule Commonly known as:  MICROZIDE Take 1 capsule (12.5 mg total) by mouth daily.      Follow-up Information    Marden Noble, MD. Schedule an appointment as soon as possible for a visit in 1 week(s).   Specialty:  Internal Medicine Contact information: [address] [place] [address] [phone]        Fransisco Hertz, MD. Schedule an appointment as soon as possible for a visit.   Specialties:  Vascular Surgery, Cardiology Contact information: [address] [place] [address] [phone]          No Known Allergies  Consultations:  Vascular surgery    Procedures/Studies: Dg Ribs Unilateral W/chest Left  Result Date: 02/23/2017 CLINICAL DATA:  Larey Seat approximately 3 weeks ago, now having LEFT side back pain, hypertensive EXAM: LEFT RIBS AND CHEST - 3+ VIEW COMPARISON:  03/05/2013 RIGHT rib radiographs and chest radiograph FINDINGS: LEFT subclavian sequential pacemaker with leads projecting over RIGHT atrium and RIGHT ventricle. Minimal enlargement of cardiac silhouette. Calcified tortuous thoracic aorta. New RIGHT basilar infiltrate. Underlying emphysematous changes. Chronic accentuation of LEFT basilar markings. No pleural effusion or pneumothorax. Bones appear demineralized. No definite LEFT rib fracture or bone destruction. IMPRESSION: No acute LEFT rib abnormalities. Enlargement of cardiac silhouette post pacemaker. New RIGHT basilar infiltrate question pneumonia. Followup PA and lateral chest X-ray is recommended in 3-4 weeks following trial of antibiotic therapy to ensure resolution and exclude underlying malignancy. Electronically Signed   By: Ulyses Southward M.D.   On: 02/23/2017 22:27   Ct Angio Chest/abd/pel For Dissection W And/or W/wo  Result Date: 02/24/2017 CLINICAL DATA:  Mid back pain.  Hypoxia. EXAM: CT ANGIOGRAPHY CHEST, ABDOMEN AND PELVIS TECHNIQUE: Multidetector CT imaging through the chest, abdomen and  pelvis was performed using the standard protocol during bolus administration of intravenous contrast. Multiplanar reconstructed images and MIPs were obtained and reviewed to evaluate the vascular anatomy. CONTRAST:  Isovue 370 intravenously. COMPARISON:  Chest radiograph 02/23/2017 FINDINGS: CTA CHEST FINDINGS Cardiovascular: Mildly enlarged heart. No pericardial effusion. Calcific atherosclerotic disease of the coronary arteries. Heavy calcific atherosclerotic disease of the aorta with calcified and noncalcified plaque. Aneurysmal dilation of the distal thoracic aorta with the lumen measuring 5.1 x 5.4 cm. Large amount of intramural clot along the medial posterior wall of the aneurysmal aorta. Small intimal flap seen in the superior-most portion and the aneurysmally dilated thoracic aorta, image 72/ 92, sequence 8, at the site of probable ulcerated atherosclerotic plaque. Mediastinum/Nodes: No enlarged mediastinal, hilar, or axillary lymph nodes. Thyroid gland, trachea, and esophagus demonstrate no significant findings. Lungs/Pleura: Chronic interstitial lung changes. Bilateral apical pleural-parenchymal scarring. Partial right middle lobe collapse. Musculoskeletal: No evidence of fracture. Review of the MIP images confirms the above findings. CTA ABDOMEN AND PELVIS FINDINGS VASCULAR Aorta: The aneurysmal thoracic aorta extends  to the superior abdomen, where it measures 2.6 x 2.8 cm. Calcified and noncalcified atherosclerotic plaque throughout. Fusiform inferior abdominal aortic aneurysm measures 2.4 x 2.3 cm, image 150/292, sequence 8. 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 SMA: Proximal SMA is occluded, with reconstitution of flow 2.3 cm distal to the origin of the vessel from collateral arteries from the celiac axis, image 130/292, axial images and image 93/169, sequence 12 sagittal images. Renals: Diminutive appearance of the right renal  artery due to atherosclerotic plaque at its origin. Normal caliber of the left renal artery. IMA: Patent. Inflow: Patent without evidence of aneurysm, dissection, vasculitis or significant stenosis. Veins: No obvious venous abnormality within the limitations of this arterial phase study. Review of the MIP images confirms the above findings. NON-VASCULAR Hepatobiliary: No focal liver abnormality is seen. No gallstones, gallbladder wall thickening, or biliary dilatation. Tiny gallbladder stone dependently. Pancreas: Unremarkable. No pancreatic ductal dilatation or surrounding inflammatory changes. Spleen: Normal in size without focal abnormality. Adrenals/Urinary Tract: Normal adrenal glands and left kidney. Small right kidney. Stomach/Bowel: Stomach is within normal limits. No evidence of bowel wall thickening, distention, or inflammatory changes. Scattered colonic diverticulosis. Lymphatic: Aortic atherosclerosis. No enlarged abdominal or pelvic lymph nodes. Reproductive: Status post hysterectomy. No adnexal masses. Other: No abdominal wall hernia or abnormality. No abdominopelvic ascites. Musculoskeletal: Multilevel osteoarthritic changes. Osteopenia. No evidence of pathologic fractures. Review of the MIP images confirms the above findings. IMPRESSION: Advance atherosclerotic disease of the aorta, with fusiform aneurysmal dilation of the distal thoracic aorta, measuring 5.4 cm in greatest transverse dimension, with large amount of intramural thrombus. Small penetrating ulcer at the superior portion of the aneurysmally dilated aorta. Fusiform aneurysmal dilation of the infrarenal abdominal aorta measuring 2.4 cm in greatest transverse dimension. 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 Diminutive right renal artery due to atherosclerotic disease, with small size of the right kidney, likely due to hypoperfusion. Chronic  interstitial lung changes with partial collapse of the right middle lobe. Electronically Signed   By: Ted Mcalpine M.D.   On: 02/24/2017 01:16      Discharge Exam: Vitals:   02/25/17 0617 02/25/17 0905  BP: (!) 156/64 (!) 160/70  Pulse: 98 99  Resp: 18   Temp:    SpO2:     Vitals:   02/25/17 0131 02/25/17 0554 02/25/17 0617 02/25/17 0905  BP: (!) 147/68 (!) 173/67 (!) 156/64 (!) 160/70  Pulse: 98 96 98 99  Resp:  (!) 24 18   Temp:  98.5 F (36.9 C)    TempSrc:      SpO2:  92%    Weight:      Height:        General: Pt is alert, awake, not in acute distress Cardiovascular: RRR, S1/S2 +, no rubs, no gallops Respiratory: CTA bilaterally, no wheezing, no rhonchi Abdominal: Soft, NT, ND, bowel sounds + Extremities: no edema, no cyanosis    The results of significant diagnostics from this hospitalization (including imaging, microbiology, ancillary and laboratory) are listed below for reference.     Microbiology: No results found for this or any previous visit (from the past 240 hour(s)).   Labs: BNP (last 3 results) No results for input(s): BNP in the last 8760 hours. Basic Metabolic Panel: Recent Labs  Lab 02/23/17 1828 02/24/17 0751 02/25/17 0524  NA 139 139 140  K 5.7* 3.8 3.4*  CL 108 104 106  CO2 23 27 27   GLUCOSE 103* 109* 93  BUN 31* 23* 16  CREATININE 1.54* 1.22* 1.16*  CALCIUM 9.8 9.4 9.5   Liver Function Tests: Recent Labs  Lab 02/23/17 1828 02/24/17 0751  AST 29 20  ALT 7* 8*  ALKPHOS 121 104  BILITOT 0.5 0.5  PROT 7.0 6.1*  ALBUMIN 3.5 3.0*   No results for input(s): LIPASE, AMYLASE in the last 168 hours. No results for input(s): AMMONIA in the last 168 hours. CBC: Recent Labs  Lab 02/23/17 1828 02/24/17 0751 02/25/17 0524  WBC 9.0 5.9 8.5  HGB 12.1 10.5* 10.9*  HCT 39.2 33.1* 34.8*  MCV 100.8* 100.6* 99.1  PLT 237 172 201   Cardiac Enzymes: No results for input(s): CKTOTAL, CKMB, CKMBINDEX, TROPONINI in the last 168  hours. BNP: Invalid input(s): POCBNP CBG: No results for input(s): GLUCAP in the last 168 hours. D-Dimer No results for input(s): DDIMER in the last 72 hours. Hgb A1c No results for input(s): HGBA1C in the last 72 hours. Lipid Profile Recent Labs    02/24/17 0800  CHOL 113  HDL 47  LDLCALC 55  TRIG 53  CHOLHDL 2.4   Thyroid function studies No results for input(s): TSH, T4TOTAL, T3FREE, THYROIDAB in the last 72 hours.  Invalid input(s): FREET3 Anemia work up No results for input(s): VITAMINB12, FOLATE, FERRITIN, TIBC, IRON, RETICCTPCT in the last 72 hours. Urinalysis    Component Value Date/Time   COLORURINE STRAW (A) 02/23/2017 2120   APPEARANCEUR CLEAR 02/23/2017 2120   LABSPEC 1.016 02/23/2017 2120   PHURINE 5.0 02/23/2017 2120   GLUCOSEU NEGATIVE 02/23/2017 2120   HGBUR NEGATIVE 02/23/2017 2120   BILIRUBINUR NEGATIVE 02/23/2017 2120   KETONESUR NEGATIVE 02/23/2017 2120   PROTEINUR NEGATIVE 02/23/2017 2120   UROBILINOGEN 0.2 07/17/2011 1613   NITRITE NEGATIVE 02/23/2017 2120   LEUKOCYTESUR NEGATIVE 02/23/2017 2120   Sepsis Labs Invalid input(s): PROCALCITONIN,  WBC,  LACTICIDVEN Microbiology No results found for this or any previous visit (from the past 240 hour(s)).   Time coordinating discharge: 40 minutes  SIGNED:  Noralee StainJennifer Kaniah Rizzolo, DO [facility] Pager [phone]  If 7PM-7AM, please contact night-coverage [email or web address] Password [email or web address] 02/25/2017, 2:16 PM

## 2017-02-26 NOTE — Consult Note (Signed)
            [facility] CM Primary Care Navigator  02/26/2017  Mearl LatinHelen Givan 12/25/1923 [id number]   Went to see patient at the bedside to identify possible discharge needs but she was alreadydischarged per RN report.  Patient was discharged to home yesterday.  Primary care provider's office is listed as providing transition of care (TOC).Patient has a discharge instruction to follow-up with Dr. Marden Nobleobert Gates- primary care provider in 1 week.   For additional questions please contact:  Karin GoldenLorraine A. Oddie Bottger, BSN, RN-BC [facility] PRIMARY CARE Navigator Cell: [phone]

## 2017-03-10 DIAGNOSIS — I10 Essential (primary) hypertension: Secondary | ICD-10-CM | POA: Diagnosis not present

## 2017-03-10 DIAGNOSIS — R5383 Other fatigue: Secondary | ICD-10-CM | POA: Diagnosis not present

## 2017-03-10 DIAGNOSIS — M545 Low back pain: Secondary | ICD-10-CM | POA: Diagnosis not present

## 2017-03-10 DIAGNOSIS — J449 Chronic obstructive pulmonary disease, unspecified: Secondary | ICD-10-CM | POA: Diagnosis not present

## 2017-03-10 DIAGNOSIS — I700 Atherosclerosis of aorta: Secondary | ICD-10-CM | POA: Diagnosis not present

## 2017-07-01 ENCOUNTER — Ambulatory Visit
Admission: RE | Admit: 2017-07-01 | Discharge: 2017-07-01 | Disposition: A | Discharge status: Home / Self Care | Payer: Medicare Other | Source: Ambulatory Visit | Attending: Internal Medicine | Admitting: Internal Medicine

## 2017-07-01 ENCOUNTER — Other Ambulatory Visit: Payer: Self-pay | Admitting: Internal Medicine

## 2017-07-01 DIAGNOSIS — I739 Peripheral vascular disease, unspecified: Secondary | ICD-10-CM | POA: Diagnosis not present

## 2017-07-01 DIAGNOSIS — I482 Chronic atrial fibrillation: Secondary | ICD-10-CM | POA: Diagnosis not present

## 2017-07-01 DIAGNOSIS — R0902 Hypoxemia: Secondary | ICD-10-CM | POA: Diagnosis not present

## 2017-07-01 DIAGNOSIS — I10 Essential (primary) hypertension: Secondary | ICD-10-CM | POA: Diagnosis not present

## 2017-07-01 DIAGNOSIS — J449 Chronic obstructive pulmonary disease, unspecified: Secondary | ICD-10-CM

## 2017-07-01 DIAGNOSIS — R269 Unspecified abnormalities of gait and mobility: Secondary | ICD-10-CM | POA: Diagnosis not present

## 2017-07-01 DIAGNOSIS — D81818 Other biotin-dependent carboxylase deficiency: Secondary | ICD-10-CM | POA: Diagnosis not present

## 2017-07-01 DIAGNOSIS — R413 Other amnesia: Secondary | ICD-10-CM | POA: Diagnosis not present

## 2017-07-01 DIAGNOSIS — M545 Low back pain: Secondary | ICD-10-CM | POA: Diagnosis not present

## 2017-07-01 DIAGNOSIS — I700 Atherosclerosis of aorta: Secondary | ICD-10-CM | POA: Diagnosis not present

## 2017-07-01 DIAGNOSIS — J439 Emphysema, unspecified: Secondary | ICD-10-CM | POA: Diagnosis not present

## 2017-07-01 DIAGNOSIS — R634 Abnormal weight loss: Secondary | ICD-10-CM | POA: Diagnosis not present

## 2017-07-01 DIAGNOSIS — R5383 Other fatigue: Secondary | ICD-10-CM | POA: Diagnosis not present

## 2017-07-04 DIAGNOSIS — M8580 Other specified disorders of bone density and structure, unspecified site: Secondary | ICD-10-CM | POA: Diagnosis not present

## 2017-07-04 DIAGNOSIS — I4891 Unspecified atrial fibrillation: Secondary | ICD-10-CM | POA: Diagnosis not present

## 2017-07-04 DIAGNOSIS — I2510 Atherosclerotic heart disease of native coronary artery without angina pectoris: Secondary | ICD-10-CM | POA: Diagnosis not present

## 2017-07-04 DIAGNOSIS — I739 Peripheral vascular disease, unspecified: Secondary | ICD-10-CM | POA: Diagnosis not present

## 2017-07-04 DIAGNOSIS — J449 Chronic obstructive pulmonary disease, unspecified: Secondary | ICD-10-CM | POA: Diagnosis not present

## 2017-07-04 DIAGNOSIS — I10 Essential (primary) hypertension: Secondary | ICD-10-CM | POA: Diagnosis not present

## 2017-07-04 DIAGNOSIS — M6281 Muscle weakness (generalized): Secondary | ICD-10-CM | POA: Diagnosis not present

## 2017-07-04 DIAGNOSIS — G500 Trigeminal neuralgia: Secondary | ICD-10-CM | POA: Diagnosis not present

## 2017-07-09 DIAGNOSIS — G500 Trigeminal neuralgia: Secondary | ICD-10-CM | POA: Diagnosis not present

## 2017-07-09 DIAGNOSIS — I2510 Atherosclerotic heart disease of native coronary artery without angina pectoris: Secondary | ICD-10-CM | POA: Diagnosis not present

## 2017-07-09 DIAGNOSIS — J449 Chronic obstructive pulmonary disease, unspecified: Secondary | ICD-10-CM | POA: Diagnosis not present

## 2017-07-09 DIAGNOSIS — M6281 Muscle weakness (generalized): Secondary | ICD-10-CM | POA: Diagnosis not present

## 2017-07-09 DIAGNOSIS — M8580 Other specified disorders of bone density and structure, unspecified site: Secondary | ICD-10-CM | POA: Diagnosis not present

## 2017-07-09 DIAGNOSIS — I10 Essential (primary) hypertension: Secondary | ICD-10-CM | POA: Diagnosis not present

## 2017-07-12 DIAGNOSIS — I2510 Atherosclerotic heart disease of native coronary artery without angina pectoris: Secondary | ICD-10-CM | POA: Diagnosis not present

## 2017-07-12 DIAGNOSIS — M8580 Other specified disorders of bone density and structure, unspecified site: Secondary | ICD-10-CM | POA: Diagnosis not present

## 2017-07-12 DIAGNOSIS — I10 Essential (primary) hypertension: Secondary | ICD-10-CM | POA: Diagnosis not present

## 2017-07-12 DIAGNOSIS — G500 Trigeminal neuralgia: Secondary | ICD-10-CM | POA: Diagnosis not present

## 2017-07-12 DIAGNOSIS — J449 Chronic obstructive pulmonary disease, unspecified: Secondary | ICD-10-CM | POA: Diagnosis not present

## 2017-07-12 DIAGNOSIS — M6281 Muscle weakness (generalized): Secondary | ICD-10-CM | POA: Diagnosis not present

## 2017-07-13 DIAGNOSIS — J449 Chronic obstructive pulmonary disease, unspecified: Secondary | ICD-10-CM | POA: Diagnosis not present

## 2017-07-13 DIAGNOSIS — I10 Essential (primary) hypertension: Secondary | ICD-10-CM | POA: Diagnosis not present

## 2017-07-13 DIAGNOSIS — G500 Trigeminal neuralgia: Secondary | ICD-10-CM | POA: Diagnosis not present

## 2017-07-13 DIAGNOSIS — I2510 Atherosclerotic heart disease of native coronary artery without angina pectoris: Secondary | ICD-10-CM | POA: Diagnosis not present

## 2017-07-13 DIAGNOSIS — M6281 Muscle weakness (generalized): Secondary | ICD-10-CM | POA: Diagnosis not present

## 2017-07-13 DIAGNOSIS — M8580 Other specified disorders of bone density and structure, unspecified site: Secondary | ICD-10-CM | POA: Diagnosis not present

## 2017-07-16 DIAGNOSIS — M6281 Muscle weakness (generalized): Secondary | ICD-10-CM | POA: Diagnosis not present

## 2017-07-16 DIAGNOSIS — I10 Essential (primary) hypertension: Secondary | ICD-10-CM | POA: Diagnosis not present

## 2017-07-16 DIAGNOSIS — G500 Trigeminal neuralgia: Secondary | ICD-10-CM | POA: Diagnosis not present

## 2017-07-16 DIAGNOSIS — M8580 Other specified disorders of bone density and structure, unspecified site: Secondary | ICD-10-CM | POA: Diagnosis not present

## 2017-07-16 DIAGNOSIS — I2510 Atherosclerotic heart disease of native coronary artery without angina pectoris: Secondary | ICD-10-CM | POA: Diagnosis not present

## 2017-07-16 DIAGNOSIS — J449 Chronic obstructive pulmonary disease, unspecified: Secondary | ICD-10-CM | POA: Diagnosis not present

## 2017-07-20 DIAGNOSIS — M6281 Muscle weakness (generalized): Secondary | ICD-10-CM | POA: Diagnosis not present

## 2017-07-20 DIAGNOSIS — J449 Chronic obstructive pulmonary disease, unspecified: Secondary | ICD-10-CM | POA: Diagnosis not present

## 2017-07-20 DIAGNOSIS — I2510 Atherosclerotic heart disease of native coronary artery without angina pectoris: Secondary | ICD-10-CM | POA: Diagnosis not present

## 2017-07-20 DIAGNOSIS — G500 Trigeminal neuralgia: Secondary | ICD-10-CM | POA: Diagnosis not present

## 2017-07-20 DIAGNOSIS — M8580 Other specified disorders of bone density and structure, unspecified site: Secondary | ICD-10-CM | POA: Diagnosis not present

## 2017-07-20 DIAGNOSIS — I10 Essential (primary) hypertension: Secondary | ICD-10-CM | POA: Diagnosis not present

## 2017-07-21 DIAGNOSIS — I10 Essential (primary) hypertension: Secondary | ICD-10-CM | POA: Diagnosis not present

## 2017-07-21 DIAGNOSIS — M6281 Muscle weakness (generalized): Secondary | ICD-10-CM | POA: Diagnosis not present

## 2017-07-21 DIAGNOSIS — J449 Chronic obstructive pulmonary disease, unspecified: Secondary | ICD-10-CM | POA: Diagnosis not present

## 2017-07-21 DIAGNOSIS — I2510 Atherosclerotic heart disease of native coronary artery without angina pectoris: Secondary | ICD-10-CM | POA: Diagnosis not present

## 2017-07-21 DIAGNOSIS — M8580 Other specified disorders of bone density and structure, unspecified site: Secondary | ICD-10-CM | POA: Diagnosis not present

## 2017-07-21 DIAGNOSIS — G500 Trigeminal neuralgia: Secondary | ICD-10-CM | POA: Diagnosis not present

## 2017-07-22 DIAGNOSIS — I10 Essential (primary) hypertension: Secondary | ICD-10-CM | POA: Diagnosis not present

## 2017-07-22 DIAGNOSIS — G500 Trigeminal neuralgia: Secondary | ICD-10-CM | POA: Diagnosis not present

## 2017-07-22 DIAGNOSIS — J449 Chronic obstructive pulmonary disease, unspecified: Secondary | ICD-10-CM | POA: Diagnosis not present

## 2017-07-22 DIAGNOSIS — M6281 Muscle weakness (generalized): Secondary | ICD-10-CM | POA: Diagnosis not present

## 2017-07-22 DIAGNOSIS — I2510 Atherosclerotic heart disease of native coronary artery without angina pectoris: Secondary | ICD-10-CM | POA: Diagnosis not present

## 2017-07-22 DIAGNOSIS — M8580 Other specified disorders of bone density and structure, unspecified site: Secondary | ICD-10-CM | POA: Diagnosis not present

## 2017-07-23 DIAGNOSIS — M8580 Other specified disorders of bone density and structure, unspecified site: Secondary | ICD-10-CM | POA: Diagnosis not present

## 2017-07-23 DIAGNOSIS — I10 Essential (primary) hypertension: Secondary | ICD-10-CM | POA: Diagnosis not present

## 2017-07-23 DIAGNOSIS — J449 Chronic obstructive pulmonary disease, unspecified: Secondary | ICD-10-CM | POA: Diagnosis not present

## 2017-07-23 DIAGNOSIS — G500 Trigeminal neuralgia: Secondary | ICD-10-CM | POA: Diagnosis not present

## 2017-07-23 DIAGNOSIS — M6281 Muscle weakness (generalized): Secondary | ICD-10-CM | POA: Diagnosis not present

## 2017-07-23 DIAGNOSIS — I2510 Atherosclerotic heart disease of native coronary artery without angina pectoris: Secondary | ICD-10-CM | POA: Diagnosis not present

## 2017-08-03 DIAGNOSIS — J449 Chronic obstructive pulmonary disease, unspecified: Secondary | ICD-10-CM | POA: Diagnosis not present

## 2017-08-03 DIAGNOSIS — I2510 Atherosclerotic heart disease of native coronary artery without angina pectoris: Secondary | ICD-10-CM | POA: Diagnosis not present

## 2017-08-03 DIAGNOSIS — M8580 Other specified disorders of bone density and structure, unspecified site: Secondary | ICD-10-CM | POA: Diagnosis not present

## 2017-08-03 DIAGNOSIS — G500 Trigeminal neuralgia: Secondary | ICD-10-CM | POA: Diagnosis not present

## 2017-08-03 DIAGNOSIS — M6281 Muscle weakness (generalized): Secondary | ICD-10-CM | POA: Diagnosis not present

## 2017-08-03 DIAGNOSIS — I10 Essential (primary) hypertension: Secondary | ICD-10-CM | POA: Diagnosis not present

## 2017-08-04 DIAGNOSIS — M8580 Other specified disorders of bone density and structure, unspecified site: Secondary | ICD-10-CM | POA: Diagnosis not present

## 2017-08-04 DIAGNOSIS — I10 Essential (primary) hypertension: Secondary | ICD-10-CM | POA: Diagnosis not present

## 2017-08-04 DIAGNOSIS — M6281 Muscle weakness (generalized): Secondary | ICD-10-CM | POA: Diagnosis not present

## 2017-08-04 DIAGNOSIS — I2510 Atherosclerotic heart disease of native coronary artery without angina pectoris: Secondary | ICD-10-CM | POA: Diagnosis not present

## 2017-08-04 DIAGNOSIS — G500 Trigeminal neuralgia: Secondary | ICD-10-CM | POA: Diagnosis not present

## 2017-08-04 DIAGNOSIS — J449 Chronic obstructive pulmonary disease, unspecified: Secondary | ICD-10-CM | POA: Diagnosis not present

## 2017-08-06 ENCOUNTER — Ambulatory Visit: Payer: Medicare Other | Admitting: Family

## 2017-08-06 ENCOUNTER — Encounter (HOSPITAL_COMMUNITY): Payer: Medicare Other

## 2017-08-10 DIAGNOSIS — M6281 Muscle weakness (generalized): Secondary | ICD-10-CM | POA: Diagnosis not present

## 2017-08-10 DIAGNOSIS — M8580 Other specified disorders of bone density and structure, unspecified site: Secondary | ICD-10-CM | POA: Diagnosis not present

## 2017-08-10 DIAGNOSIS — G500 Trigeminal neuralgia: Secondary | ICD-10-CM | POA: Diagnosis not present

## 2017-08-10 DIAGNOSIS — J449 Chronic obstructive pulmonary disease, unspecified: Secondary | ICD-10-CM | POA: Diagnosis not present

## 2017-08-10 DIAGNOSIS — I2510 Atherosclerotic heart disease of native coronary artery without angina pectoris: Secondary | ICD-10-CM | POA: Diagnosis not present

## 2017-08-10 DIAGNOSIS — I10 Essential (primary) hypertension: Secondary | ICD-10-CM | POA: Diagnosis not present

## 2017-08-17 DIAGNOSIS — I10 Essential (primary) hypertension: Secondary | ICD-10-CM | POA: Diagnosis not present

## 2017-08-17 DIAGNOSIS — G500 Trigeminal neuralgia: Secondary | ICD-10-CM | POA: Diagnosis not present

## 2017-08-17 DIAGNOSIS — M6281 Muscle weakness (generalized): Secondary | ICD-10-CM | POA: Diagnosis not present

## 2017-08-17 DIAGNOSIS — J449 Chronic obstructive pulmonary disease, unspecified: Secondary | ICD-10-CM | POA: Diagnosis not present

## 2017-08-17 DIAGNOSIS — I2510 Atherosclerotic heart disease of native coronary artery without angina pectoris: Secondary | ICD-10-CM | POA: Diagnosis not present

## 2017-08-17 DIAGNOSIS — M8580 Other specified disorders of bone density and structure, unspecified site: Secondary | ICD-10-CM | POA: Diagnosis not present

## 2017-08-18 DIAGNOSIS — M8580 Other specified disorders of bone density and structure, unspecified site: Secondary | ICD-10-CM | POA: Diagnosis not present

## 2017-08-18 DIAGNOSIS — J449 Chronic obstructive pulmonary disease, unspecified: Secondary | ICD-10-CM | POA: Diagnosis not present

## 2017-08-18 DIAGNOSIS — I2510 Atherosclerotic heart disease of native coronary artery without angina pectoris: Secondary | ICD-10-CM | POA: Diagnosis not present

## 2017-08-18 DIAGNOSIS — M6281 Muscle weakness (generalized): Secondary | ICD-10-CM | POA: Diagnosis not present

## 2017-08-18 DIAGNOSIS — G500 Trigeminal neuralgia: Secondary | ICD-10-CM | POA: Diagnosis not present

## 2017-08-18 DIAGNOSIS — I10 Essential (primary) hypertension: Secondary | ICD-10-CM | POA: Diagnosis not present

## 2017-08-25 DIAGNOSIS — I10 Essential (primary) hypertension: Secondary | ICD-10-CM | POA: Diagnosis not present

## 2017-08-25 DIAGNOSIS — J449 Chronic obstructive pulmonary disease, unspecified: Secondary | ICD-10-CM | POA: Diagnosis not present

## 2017-08-25 DIAGNOSIS — M8580 Other specified disorders of bone density and structure, unspecified site: Secondary | ICD-10-CM | POA: Diagnosis not present

## 2017-08-25 DIAGNOSIS — M6281 Muscle weakness (generalized): Secondary | ICD-10-CM | POA: Diagnosis not present

## 2017-08-25 DIAGNOSIS — I2510 Atherosclerotic heart disease of native coronary artery without angina pectoris: Secondary | ICD-10-CM | POA: Diagnosis not present

## 2017-08-25 DIAGNOSIS — G500 Trigeminal neuralgia: Secondary | ICD-10-CM | POA: Diagnosis not present

## 2017-08-27 DIAGNOSIS — I10 Essential (primary) hypertension: Secondary | ICD-10-CM | POA: Diagnosis not present

## 2017-08-27 DIAGNOSIS — G500 Trigeminal neuralgia: Secondary | ICD-10-CM | POA: Diagnosis not present

## 2017-08-27 DIAGNOSIS — J449 Chronic obstructive pulmonary disease, unspecified: Secondary | ICD-10-CM | POA: Diagnosis not present

## 2017-08-27 DIAGNOSIS — I2510 Atherosclerotic heart disease of native coronary artery without angina pectoris: Secondary | ICD-10-CM | POA: Diagnosis not present

## 2017-08-27 DIAGNOSIS — M6281 Muscle weakness (generalized): Secondary | ICD-10-CM | POA: Diagnosis not present

## 2017-08-27 DIAGNOSIS — M8580 Other specified disorders of bone density and structure, unspecified site: Secondary | ICD-10-CM | POA: Diagnosis not present

## 2017-09-15 ENCOUNTER — Ambulatory Visit: Payer: Medicare Other | Admitting: Vascular Surgery

## 2017-09-15 ENCOUNTER — Encounter (HOSPITAL_COMMUNITY): Payer: Medicare Other

## 2017-09-24 DIAGNOSIS — E46 Unspecified protein-calorie malnutrition: Secondary | ICD-10-CM | POA: Diagnosis not present

## 2017-09-24 DIAGNOSIS — Z79899 Other long term (current) drug therapy: Secondary | ICD-10-CM | POA: Diagnosis not present

## 2017-09-24 DIAGNOSIS — G4700 Insomnia, unspecified: Secondary | ICD-10-CM | POA: Diagnosis not present

## 2017-09-24 DIAGNOSIS — Z7189 Other specified counseling: Secondary | ICD-10-CM | POA: Diagnosis not present

## 2017-09-24 DIAGNOSIS — I4891 Unspecified atrial fibrillation: Secondary | ICD-10-CM | POA: Diagnosis not present

## 2017-09-24 DIAGNOSIS — R410 Disorientation, unspecified: Secondary | ICD-10-CM | POA: Diagnosis not present

## 2017-09-24 DIAGNOSIS — R296 Repeated falls: Secondary | ICD-10-CM | POA: Diagnosis not present

## 2017-10-02 DIAGNOSIS — J449 Chronic obstructive pulmonary disease, unspecified: Secondary | ICD-10-CM | POA: Diagnosis not present

## 2017-10-02 DIAGNOSIS — G309 Alzheimer's disease, unspecified: Secondary | ICD-10-CM | POA: Diagnosis not present

## 2017-10-02 DIAGNOSIS — I712 Thoracic aortic aneurysm, without rupture: Secondary | ICD-10-CM | POA: Diagnosis not present

## 2017-10-02 DIAGNOSIS — I70209 Unspecified atherosclerosis of native arteries of extremities, unspecified extremity: Secondary | ICD-10-CM | POA: Diagnosis not present

## 2017-10-02 DIAGNOSIS — I4891 Unspecified atrial fibrillation: Secondary | ICD-10-CM | POA: Diagnosis not present

## 2017-10-02 DIAGNOSIS — I10 Essential (primary) hypertension: Secondary | ICD-10-CM | POA: Diagnosis not present

## 2017-10-02 DIAGNOSIS — K551 Chronic vascular disorders of intestine: Secondary | ICD-10-CM | POA: Diagnosis not present

## 2017-10-03 DIAGNOSIS — I70209 Unspecified atherosclerosis of native arteries of extremities, unspecified extremity: Secondary | ICD-10-CM | POA: Diagnosis not present

## 2017-10-03 DIAGNOSIS — K551 Chronic vascular disorders of intestine: Secondary | ICD-10-CM | POA: Diagnosis not present

## 2017-10-03 DIAGNOSIS — I4891 Unspecified atrial fibrillation: Secondary | ICD-10-CM | POA: Diagnosis not present

## 2017-10-03 DIAGNOSIS — G309 Alzheimer's disease, unspecified: Secondary | ICD-10-CM | POA: Diagnosis not present

## 2017-10-03 DIAGNOSIS — I712 Thoracic aortic aneurysm, without rupture: Secondary | ICD-10-CM | POA: Diagnosis not present

## 2017-10-03 DIAGNOSIS — J449 Chronic obstructive pulmonary disease, unspecified: Secondary | ICD-10-CM | POA: Diagnosis not present

## 2017-10-04 DIAGNOSIS — J449 Chronic obstructive pulmonary disease, unspecified: Secondary | ICD-10-CM | POA: Diagnosis not present

## 2017-10-04 DIAGNOSIS — G309 Alzheimer's disease, unspecified: Secondary | ICD-10-CM | POA: Diagnosis not present

## 2017-10-04 DIAGNOSIS — I712 Thoracic aortic aneurysm, without rupture: Secondary | ICD-10-CM | POA: Diagnosis not present

## 2017-10-04 DIAGNOSIS — I4891 Unspecified atrial fibrillation: Secondary | ICD-10-CM | POA: Diagnosis not present

## 2017-10-04 DIAGNOSIS — I70209 Unspecified atherosclerosis of native arteries of extremities, unspecified extremity: Secondary | ICD-10-CM | POA: Diagnosis not present

## 2017-10-04 DIAGNOSIS — K551 Chronic vascular disorders of intestine: Secondary | ICD-10-CM | POA: Diagnosis not present

## 2017-10-05 DIAGNOSIS — I70209 Unspecified atherosclerosis of native arteries of extremities, unspecified extremity: Secondary | ICD-10-CM | POA: Diagnosis not present

## 2017-10-05 DIAGNOSIS — J449 Chronic obstructive pulmonary disease, unspecified: Secondary | ICD-10-CM | POA: Diagnosis not present

## 2017-10-05 DIAGNOSIS — K551 Chronic vascular disorders of intestine: Secondary | ICD-10-CM | POA: Diagnosis not present

## 2017-10-05 DIAGNOSIS — I712 Thoracic aortic aneurysm, without rupture: Secondary | ICD-10-CM | POA: Diagnosis not present

## 2017-10-05 DIAGNOSIS — G309 Alzheimer's disease, unspecified: Secondary | ICD-10-CM | POA: Diagnosis not present

## 2017-10-05 DIAGNOSIS — I4891 Unspecified atrial fibrillation: Secondary | ICD-10-CM | POA: Diagnosis not present

## 2017-10-07 DIAGNOSIS — J449 Chronic obstructive pulmonary disease, unspecified: Secondary | ICD-10-CM | POA: Diagnosis not present

## 2017-10-07 DIAGNOSIS — I70209 Unspecified atherosclerosis of native arteries of extremities, unspecified extremity: Secondary | ICD-10-CM | POA: Diagnosis not present

## 2017-10-07 DIAGNOSIS — G309 Alzheimer's disease, unspecified: Secondary | ICD-10-CM | POA: Diagnosis not present

## 2017-10-07 DIAGNOSIS — I4891 Unspecified atrial fibrillation: Secondary | ICD-10-CM | POA: Diagnosis not present

## 2017-10-07 DIAGNOSIS — K551 Chronic vascular disorders of intestine: Secondary | ICD-10-CM | POA: Diagnosis not present

## 2017-10-07 DIAGNOSIS — I712 Thoracic aortic aneurysm, without rupture: Secondary | ICD-10-CM | POA: Diagnosis not present

## 2017-10-11 DIAGNOSIS — I712 Thoracic aortic aneurysm, without rupture: Secondary | ICD-10-CM | POA: Diagnosis not present

## 2017-10-11 DIAGNOSIS — I70209 Unspecified atherosclerosis of native arteries of extremities, unspecified extremity: Secondary | ICD-10-CM | POA: Diagnosis not present

## 2017-10-11 DIAGNOSIS — I4891 Unspecified atrial fibrillation: Secondary | ICD-10-CM | POA: Diagnosis not present

## 2017-10-11 DIAGNOSIS — J449 Chronic obstructive pulmonary disease, unspecified: Secondary | ICD-10-CM | POA: Diagnosis not present

## 2017-10-11 DIAGNOSIS — K551 Chronic vascular disorders of intestine: Secondary | ICD-10-CM | POA: Diagnosis not present

## 2017-10-11 DIAGNOSIS — G309 Alzheimer's disease, unspecified: Secondary | ICD-10-CM | POA: Diagnosis not present

## 2017-10-13 DIAGNOSIS — I712 Thoracic aortic aneurysm, without rupture: Secondary | ICD-10-CM | POA: Diagnosis not present

## 2017-10-13 DIAGNOSIS — J449 Chronic obstructive pulmonary disease, unspecified: Secondary | ICD-10-CM | POA: Diagnosis not present

## 2017-10-13 DIAGNOSIS — K551 Chronic vascular disorders of intestine: Secondary | ICD-10-CM | POA: Diagnosis not present

## 2017-10-13 DIAGNOSIS — G309 Alzheimer's disease, unspecified: Secondary | ICD-10-CM | POA: Diagnosis not present

## 2017-10-13 DIAGNOSIS — I4891 Unspecified atrial fibrillation: Secondary | ICD-10-CM | POA: Diagnosis not present

## 2017-10-13 DIAGNOSIS — I70209 Unspecified atherosclerosis of native arteries of extremities, unspecified extremity: Secondary | ICD-10-CM | POA: Diagnosis not present

## 2017-10-14 DIAGNOSIS — I712 Thoracic aortic aneurysm, without rupture: Secondary | ICD-10-CM | POA: Diagnosis not present

## 2017-10-14 DIAGNOSIS — G309 Alzheimer's disease, unspecified: Secondary | ICD-10-CM | POA: Diagnosis not present

## 2017-10-14 DIAGNOSIS — K551 Chronic vascular disorders of intestine: Secondary | ICD-10-CM | POA: Diagnosis not present

## 2017-10-14 DIAGNOSIS — I70209 Unspecified atherosclerosis of native arteries of extremities, unspecified extremity: Secondary | ICD-10-CM | POA: Diagnosis not present

## 2017-10-14 DIAGNOSIS — I4891 Unspecified atrial fibrillation: Secondary | ICD-10-CM | POA: Diagnosis not present

## 2017-10-14 DIAGNOSIS — J449 Chronic obstructive pulmonary disease, unspecified: Secondary | ICD-10-CM | POA: Diagnosis not present

## 2017-10-15 DIAGNOSIS — J449 Chronic obstructive pulmonary disease, unspecified: Secondary | ICD-10-CM | POA: Diagnosis not present

## 2017-10-15 DIAGNOSIS — I70209 Unspecified atherosclerosis of native arteries of extremities, unspecified extremity: Secondary | ICD-10-CM | POA: Diagnosis not present

## 2017-10-15 DIAGNOSIS — I712 Thoracic aortic aneurysm, without rupture: Secondary | ICD-10-CM | POA: Diagnosis not present

## 2017-10-15 DIAGNOSIS — K551 Chronic vascular disorders of intestine: Secondary | ICD-10-CM | POA: Diagnosis not present

## 2017-10-15 DIAGNOSIS — I4891 Unspecified atrial fibrillation: Secondary | ICD-10-CM | POA: Diagnosis not present

## 2017-10-15 DIAGNOSIS — G309 Alzheimer's disease, unspecified: Secondary | ICD-10-CM | POA: Diagnosis not present

## 2017-10-18 DIAGNOSIS — I4891 Unspecified atrial fibrillation: Secondary | ICD-10-CM | POA: Diagnosis not present

## 2017-10-18 DIAGNOSIS — J449 Chronic obstructive pulmonary disease, unspecified: Secondary | ICD-10-CM | POA: Diagnosis not present

## 2017-10-18 DIAGNOSIS — K551 Chronic vascular disorders of intestine: Secondary | ICD-10-CM | POA: Diagnosis not present

## 2017-10-18 DIAGNOSIS — I70209 Unspecified atherosclerosis of native arteries of extremities, unspecified extremity: Secondary | ICD-10-CM | POA: Diagnosis not present

## 2017-10-18 DIAGNOSIS — G309 Alzheimer's disease, unspecified: Secondary | ICD-10-CM | POA: Diagnosis not present

## 2017-10-18 DIAGNOSIS — I712 Thoracic aortic aneurysm, without rupture: Secondary | ICD-10-CM | POA: Diagnosis not present

## 2017-10-20 DIAGNOSIS — K551 Chronic vascular disorders of intestine: Secondary | ICD-10-CM | POA: Diagnosis not present

## 2017-10-20 DIAGNOSIS — I4891 Unspecified atrial fibrillation: Secondary | ICD-10-CM | POA: Diagnosis not present

## 2017-10-20 DIAGNOSIS — I70209 Unspecified atherosclerosis of native arteries of extremities, unspecified extremity: Secondary | ICD-10-CM | POA: Diagnosis not present

## 2017-10-20 DIAGNOSIS — I712 Thoracic aortic aneurysm, without rupture: Secondary | ICD-10-CM | POA: Diagnosis not present

## 2017-10-20 DIAGNOSIS — J449 Chronic obstructive pulmonary disease, unspecified: Secondary | ICD-10-CM | POA: Diagnosis not present

## 2017-10-20 DIAGNOSIS — G309 Alzheimer's disease, unspecified: Secondary | ICD-10-CM | POA: Diagnosis not present

## 2017-10-21 DIAGNOSIS — I70209 Unspecified atherosclerosis of native arteries of extremities, unspecified extremity: Secondary | ICD-10-CM | POA: Diagnosis not present

## 2017-10-21 DIAGNOSIS — I10 Essential (primary) hypertension: Secondary | ICD-10-CM | POA: Diagnosis not present

## 2017-10-21 DIAGNOSIS — I4891 Unspecified atrial fibrillation: Secondary | ICD-10-CM | POA: Diagnosis not present

## 2017-10-21 DIAGNOSIS — J449 Chronic obstructive pulmonary disease, unspecified: Secondary | ICD-10-CM | POA: Diagnosis not present

## 2017-10-21 DIAGNOSIS — K551 Chronic vascular disorders of intestine: Secondary | ICD-10-CM | POA: Diagnosis not present

## 2017-10-21 DIAGNOSIS — I712 Thoracic aortic aneurysm, without rupture: Secondary | ICD-10-CM | POA: Diagnosis not present

## 2017-10-21 DIAGNOSIS — G309 Alzheimer's disease, unspecified: Secondary | ICD-10-CM | POA: Diagnosis not present

## 2017-10-22 DIAGNOSIS — I712 Thoracic aortic aneurysm, without rupture: Secondary | ICD-10-CM | POA: Diagnosis not present

## 2017-10-22 DIAGNOSIS — J449 Chronic obstructive pulmonary disease, unspecified: Secondary | ICD-10-CM | POA: Diagnosis not present

## 2017-10-22 DIAGNOSIS — I4891 Unspecified atrial fibrillation: Secondary | ICD-10-CM | POA: Diagnosis not present

## 2017-10-22 DIAGNOSIS — I70209 Unspecified atherosclerosis of native arteries of extremities, unspecified extremity: Secondary | ICD-10-CM | POA: Diagnosis not present

## 2017-10-22 DIAGNOSIS — K551 Chronic vascular disorders of intestine: Secondary | ICD-10-CM | POA: Diagnosis not present

## 2017-10-22 DIAGNOSIS — G309 Alzheimer's disease, unspecified: Secondary | ICD-10-CM | POA: Diagnosis not present

## 2017-10-25 DIAGNOSIS — J449 Chronic obstructive pulmonary disease, unspecified: Secondary | ICD-10-CM | POA: Diagnosis not present

## 2017-10-25 DIAGNOSIS — I70209 Unspecified atherosclerosis of native arteries of extremities, unspecified extremity: Secondary | ICD-10-CM | POA: Diagnosis not present

## 2017-10-25 DIAGNOSIS — K551 Chronic vascular disorders of intestine: Secondary | ICD-10-CM | POA: Diagnosis not present

## 2017-10-25 DIAGNOSIS — I4891 Unspecified atrial fibrillation: Secondary | ICD-10-CM | POA: Diagnosis not present

## 2017-10-25 DIAGNOSIS — G309 Alzheimer's disease, unspecified: Secondary | ICD-10-CM | POA: Diagnosis not present

## 2017-10-25 DIAGNOSIS — I712 Thoracic aortic aneurysm, without rupture: Secondary | ICD-10-CM | POA: Diagnosis not present

## 2017-10-26 DIAGNOSIS — K551 Chronic vascular disorders of intestine: Secondary | ICD-10-CM | POA: Diagnosis not present

## 2017-10-26 DIAGNOSIS — I4891 Unspecified atrial fibrillation: Secondary | ICD-10-CM | POA: Diagnosis not present

## 2017-10-26 DIAGNOSIS — I70209 Unspecified atherosclerosis of native arteries of extremities, unspecified extremity: Secondary | ICD-10-CM | POA: Diagnosis not present

## 2017-10-26 DIAGNOSIS — G309 Alzheimer's disease, unspecified: Secondary | ICD-10-CM | POA: Diagnosis not present

## 2017-10-26 DIAGNOSIS — J449 Chronic obstructive pulmonary disease, unspecified: Secondary | ICD-10-CM | POA: Diagnosis not present

## 2017-10-26 DIAGNOSIS — I712 Thoracic aortic aneurysm, without rupture: Secondary | ICD-10-CM | POA: Diagnosis not present

## 2017-10-27 DIAGNOSIS — I4891 Unspecified atrial fibrillation: Secondary | ICD-10-CM | POA: Diagnosis not present

## 2017-10-27 DIAGNOSIS — G309 Alzheimer's disease, unspecified: Secondary | ICD-10-CM | POA: Diagnosis not present

## 2017-10-27 DIAGNOSIS — I712 Thoracic aortic aneurysm, without rupture: Secondary | ICD-10-CM | POA: Diagnosis not present

## 2017-10-27 DIAGNOSIS — K551 Chronic vascular disorders of intestine: Secondary | ICD-10-CM | POA: Diagnosis not present

## 2017-10-27 DIAGNOSIS — J449 Chronic obstructive pulmonary disease, unspecified: Secondary | ICD-10-CM | POA: Diagnosis not present

## 2017-10-27 DIAGNOSIS — I70209 Unspecified atherosclerosis of native arteries of extremities, unspecified extremity: Secondary | ICD-10-CM | POA: Diagnosis not present

## 2017-10-28 DIAGNOSIS — I70209 Unspecified atherosclerosis of native arteries of extremities, unspecified extremity: Secondary | ICD-10-CM | POA: Diagnosis not present

## 2017-10-28 DIAGNOSIS — K551 Chronic vascular disorders of intestine: Secondary | ICD-10-CM | POA: Diagnosis not present

## 2017-10-28 DIAGNOSIS — I4891 Unspecified atrial fibrillation: Secondary | ICD-10-CM | POA: Diagnosis not present

## 2017-10-28 DIAGNOSIS — I712 Thoracic aortic aneurysm, without rupture: Secondary | ICD-10-CM | POA: Diagnosis not present

## 2017-10-28 DIAGNOSIS — G309 Alzheimer's disease, unspecified: Secondary | ICD-10-CM | POA: Diagnosis not present

## 2017-10-28 DIAGNOSIS — J449 Chronic obstructive pulmonary disease, unspecified: Secondary | ICD-10-CM | POA: Diagnosis not present

## 2017-10-29 DIAGNOSIS — K551 Chronic vascular disorders of intestine: Secondary | ICD-10-CM | POA: Diagnosis not present

## 2017-10-29 DIAGNOSIS — I4891 Unspecified atrial fibrillation: Secondary | ICD-10-CM | POA: Diagnosis not present

## 2017-10-29 DIAGNOSIS — I70209 Unspecified atherosclerosis of native arteries of extremities, unspecified extremity: Secondary | ICD-10-CM | POA: Diagnosis not present

## 2017-10-29 DIAGNOSIS — G309 Alzheimer's disease, unspecified: Secondary | ICD-10-CM | POA: Diagnosis not present

## 2017-10-29 DIAGNOSIS — J449 Chronic obstructive pulmonary disease, unspecified: Secondary | ICD-10-CM | POA: Diagnosis not present

## 2017-10-29 DIAGNOSIS — I712 Thoracic aortic aneurysm, without rupture: Secondary | ICD-10-CM | POA: Diagnosis not present

## 2017-10-30 DIAGNOSIS — I70209 Unspecified atherosclerosis of native arteries of extremities, unspecified extremity: Secondary | ICD-10-CM | POA: Diagnosis not present

## 2017-10-30 DIAGNOSIS — K551 Chronic vascular disorders of intestine: Secondary | ICD-10-CM | POA: Diagnosis not present

## 2017-10-30 DIAGNOSIS — J449 Chronic obstructive pulmonary disease, unspecified: Secondary | ICD-10-CM | POA: Diagnosis not present

## 2017-10-30 DIAGNOSIS — G309 Alzheimer's disease, unspecified: Secondary | ICD-10-CM | POA: Diagnosis not present

## 2017-10-30 DIAGNOSIS — I712 Thoracic aortic aneurysm, without rupture: Secondary | ICD-10-CM | POA: Diagnosis not present

## 2017-10-30 DIAGNOSIS — I4891 Unspecified atrial fibrillation: Secondary | ICD-10-CM | POA: Diagnosis not present

## 2017-10-31 DIAGNOSIS — K551 Chronic vascular disorders of intestine: Secondary | ICD-10-CM | POA: Diagnosis not present

## 2017-10-31 DIAGNOSIS — I712 Thoracic aortic aneurysm, without rupture: Secondary | ICD-10-CM | POA: Diagnosis not present

## 2017-10-31 DIAGNOSIS — I4891 Unspecified atrial fibrillation: Secondary | ICD-10-CM | POA: Diagnosis not present

## 2017-10-31 DIAGNOSIS — J449 Chronic obstructive pulmonary disease, unspecified: Secondary | ICD-10-CM | POA: Diagnosis not present

## 2017-10-31 DIAGNOSIS — I70209 Unspecified atherosclerosis of native arteries of extremities, unspecified extremity: Secondary | ICD-10-CM | POA: Diagnosis not present

## 2017-10-31 DIAGNOSIS — G309 Alzheimer's disease, unspecified: Secondary | ICD-10-CM | POA: Diagnosis not present

## 2017-11-01 DIAGNOSIS — I70209 Unspecified atherosclerosis of native arteries of extremities, unspecified extremity: Secondary | ICD-10-CM | POA: Diagnosis not present

## 2017-11-01 DIAGNOSIS — K551 Chronic vascular disorders of intestine: Secondary | ICD-10-CM | POA: Diagnosis not present

## 2017-11-01 DIAGNOSIS — I712 Thoracic aortic aneurysm, without rupture: Secondary | ICD-10-CM | POA: Diagnosis not present

## 2017-11-01 DIAGNOSIS — I4891 Unspecified atrial fibrillation: Secondary | ICD-10-CM | POA: Diagnosis not present

## 2017-11-01 DIAGNOSIS — J449 Chronic obstructive pulmonary disease, unspecified: Secondary | ICD-10-CM | POA: Diagnosis not present

## 2017-11-01 DIAGNOSIS — G309 Alzheimer's disease, unspecified: Secondary | ICD-10-CM | POA: Diagnosis not present

## 2017-11-02 DIAGNOSIS — I4891 Unspecified atrial fibrillation: Secondary | ICD-10-CM | POA: Diagnosis not present

## 2017-11-02 DIAGNOSIS — G309 Alzheimer's disease, unspecified: Secondary | ICD-10-CM | POA: Diagnosis not present

## 2017-11-02 DIAGNOSIS — K551 Chronic vascular disorders of intestine: Secondary | ICD-10-CM | POA: Diagnosis not present

## 2017-11-02 DIAGNOSIS — I70209 Unspecified atherosclerosis of native arteries of extremities, unspecified extremity: Secondary | ICD-10-CM | POA: Diagnosis not present

## 2017-11-02 DIAGNOSIS — J449 Chronic obstructive pulmonary disease, unspecified: Secondary | ICD-10-CM | POA: Diagnosis not present

## 2017-11-02 DIAGNOSIS — I712 Thoracic aortic aneurysm, without rupture: Secondary | ICD-10-CM | POA: Diagnosis not present

## 2017-11-03 DIAGNOSIS — K551 Chronic vascular disorders of intestine: Secondary | ICD-10-CM | POA: Diagnosis not present

## 2017-11-03 DIAGNOSIS — G309 Alzheimer's disease, unspecified: Secondary | ICD-10-CM | POA: Diagnosis not present

## 2017-11-03 DIAGNOSIS — J449 Chronic obstructive pulmonary disease, unspecified: Secondary | ICD-10-CM | POA: Diagnosis not present

## 2017-11-03 DIAGNOSIS — I712 Thoracic aortic aneurysm, without rupture: Secondary | ICD-10-CM | POA: Diagnosis not present

## 2017-11-03 DIAGNOSIS — I70209 Unspecified atherosclerosis of native arteries of extremities, unspecified extremity: Secondary | ICD-10-CM | POA: Diagnosis not present

## 2017-11-03 DIAGNOSIS — I4891 Unspecified atrial fibrillation: Secondary | ICD-10-CM | POA: Diagnosis not present

## 2017-11-04 DIAGNOSIS — I4891 Unspecified atrial fibrillation: Secondary | ICD-10-CM | POA: Diagnosis not present

## 2017-11-04 DIAGNOSIS — G309 Alzheimer's disease, unspecified: Secondary | ICD-10-CM | POA: Diagnosis not present

## 2017-11-04 DIAGNOSIS — K551 Chronic vascular disorders of intestine: Secondary | ICD-10-CM | POA: Diagnosis not present

## 2017-11-04 DIAGNOSIS — I70209 Unspecified atherosclerosis of native arteries of extremities, unspecified extremity: Secondary | ICD-10-CM | POA: Diagnosis not present

## 2017-11-04 DIAGNOSIS — I712 Thoracic aortic aneurysm, without rupture: Secondary | ICD-10-CM | POA: Diagnosis not present

## 2017-11-04 DIAGNOSIS — J449 Chronic obstructive pulmonary disease, unspecified: Secondary | ICD-10-CM | POA: Diagnosis not present

## 2017-11-05 DIAGNOSIS — G309 Alzheimer's disease, unspecified: Secondary | ICD-10-CM | POA: Diagnosis not present

## 2017-11-05 DIAGNOSIS — I4891 Unspecified atrial fibrillation: Secondary | ICD-10-CM | POA: Diagnosis not present

## 2017-11-05 DIAGNOSIS — I712 Thoracic aortic aneurysm, without rupture: Secondary | ICD-10-CM | POA: Diagnosis not present

## 2017-11-05 DIAGNOSIS — J449 Chronic obstructive pulmonary disease, unspecified: Secondary | ICD-10-CM | POA: Diagnosis not present

## 2017-11-05 DIAGNOSIS — K551 Chronic vascular disorders of intestine: Secondary | ICD-10-CM | POA: Diagnosis not present

## 2017-11-05 DIAGNOSIS — I70209 Unspecified atherosclerosis of native arteries of extremities, unspecified extremity: Secondary | ICD-10-CM | POA: Diagnosis not present

## 2017-11-06 DIAGNOSIS — K551 Chronic vascular disorders of intestine: Secondary | ICD-10-CM | POA: Diagnosis not present

## 2017-11-06 DIAGNOSIS — J449 Chronic obstructive pulmonary disease, unspecified: Secondary | ICD-10-CM | POA: Diagnosis not present

## 2017-11-06 DIAGNOSIS — G309 Alzheimer's disease, unspecified: Secondary | ICD-10-CM | POA: Diagnosis not present

## 2017-11-06 DIAGNOSIS — I70209 Unspecified atherosclerosis of native arteries of extremities, unspecified extremity: Secondary | ICD-10-CM | POA: Diagnosis not present

## 2017-11-06 DIAGNOSIS — I4891 Unspecified atrial fibrillation: Secondary | ICD-10-CM | POA: Diagnosis not present

## 2017-11-06 DIAGNOSIS — I712 Thoracic aortic aneurysm, without rupture: Secondary | ICD-10-CM | POA: Diagnosis not present

## 2017-11-07 DIAGNOSIS — G309 Alzheimer's disease, unspecified: Secondary | ICD-10-CM | POA: Diagnosis not present

## 2017-11-07 DIAGNOSIS — J449 Chronic obstructive pulmonary disease, unspecified: Secondary | ICD-10-CM | POA: Diagnosis not present

## 2017-11-07 DIAGNOSIS — K551 Chronic vascular disorders of intestine: Secondary | ICD-10-CM | POA: Diagnosis not present

## 2017-11-07 DIAGNOSIS — I4891 Unspecified atrial fibrillation: Secondary | ICD-10-CM | POA: Diagnosis not present

## 2017-11-07 DIAGNOSIS — I712 Thoracic aortic aneurysm, without rupture: Secondary | ICD-10-CM | POA: Diagnosis not present

## 2017-11-07 DIAGNOSIS — I70209 Unspecified atherosclerosis of native arteries of extremities, unspecified extremity: Secondary | ICD-10-CM | POA: Diagnosis not present

## 2017-11-08 DIAGNOSIS — I712 Thoracic aortic aneurysm, without rupture: Secondary | ICD-10-CM | POA: Diagnosis not present

## 2017-11-08 DIAGNOSIS — J449 Chronic obstructive pulmonary disease, unspecified: Secondary | ICD-10-CM | POA: Diagnosis not present

## 2017-11-08 DIAGNOSIS — K551 Chronic vascular disorders of intestine: Secondary | ICD-10-CM | POA: Diagnosis not present

## 2017-11-08 DIAGNOSIS — I70209 Unspecified atherosclerosis of native arteries of extremities, unspecified extremity: Secondary | ICD-10-CM | POA: Diagnosis not present

## 2017-11-08 DIAGNOSIS — G309 Alzheimer's disease, unspecified: Secondary | ICD-10-CM | POA: Diagnosis not present

## 2017-11-08 DIAGNOSIS — I4891 Unspecified atrial fibrillation: Secondary | ICD-10-CM | POA: Diagnosis not present

## 2017-11-21 DEATH — deceased
